# Patient Record
Sex: Female | Born: 1980 | Race: White | Hispanic: No | Marital: Married | Smoking: Never smoker
Health system: Southern US, Community
[De-identification: ages and names within clinical notes are randomized; demographics above are authoritative.]

## PROBLEM LIST (undated history)

## (undated) DIAGNOSIS — O169 Unspecified maternal hypertension, unspecified trimester: Secondary | ICD-10-CM

## (undated) DIAGNOSIS — M419 Scoliosis, unspecified: Secondary | ICD-10-CM

## (undated) DIAGNOSIS — G43909 Migraine, unspecified, not intractable, without status migrainosus: Secondary | ICD-10-CM

## (undated) HISTORY — PX: CHOLECYSTECTOMY: SHX55

## (undated) HISTORY — PX: OTHER SURGICAL HISTORY: SHX169

## (undated) HISTORY — PX: TONSILLECTOMY: SUR1361

## (undated) HISTORY — DX: Unspecified maternal hypertension, unspecified trimester: O16.9

## (undated) HISTORY — DX: Scoliosis, unspecified: M41.9

## (undated) HISTORY — DX: Migraine, unspecified, not intractable, without status migrainosus: G43.909

---

## 2007-09-07 ENCOUNTER — Encounter: Payer: Self-pay | Admitting: Family Medicine

## 2007-09-10 ENCOUNTER — Encounter: Payer: Self-pay | Admitting: Family Medicine

## 2007-09-15 ENCOUNTER — Emergency Department: Payer: Self-pay | Admitting: Emergency Medicine

## 2007-09-15 ENCOUNTER — Other Ambulatory Visit: Payer: Self-pay

## 2007-10-31 ENCOUNTER — Observation Stay: Payer: Self-pay | Admitting: Certified Nurse Midwife

## 2007-11-05 ENCOUNTER — Observation Stay: Payer: Self-pay | Admitting: Obstetrics and Gynecology

## 2007-11-11 ENCOUNTER — Ambulatory Visit: Payer: Self-pay | Admitting: Obstetrics and Gynecology

## 2007-11-14 ENCOUNTER — Inpatient Hospital Stay: Payer: Self-pay | Admitting: Obstetrics and Gynecology

## 2008-09-25 ENCOUNTER — Emergency Department: Payer: Self-pay | Admitting: Emergency Medicine

## 2008-12-27 ENCOUNTER — Emergency Department: Payer: Self-pay | Admitting: Emergency Medicine

## 2009-06-14 IMAGING — CR DG CHEST 2V
1 series · 2 of 2 positions shown · non-contrast
Comparison: none

REASON FOR EXAM: cp
COMMENTS:

[Series 1: view not recorded · 0.17mm/px · 2 of 2 slices shown]
[im 1/2]
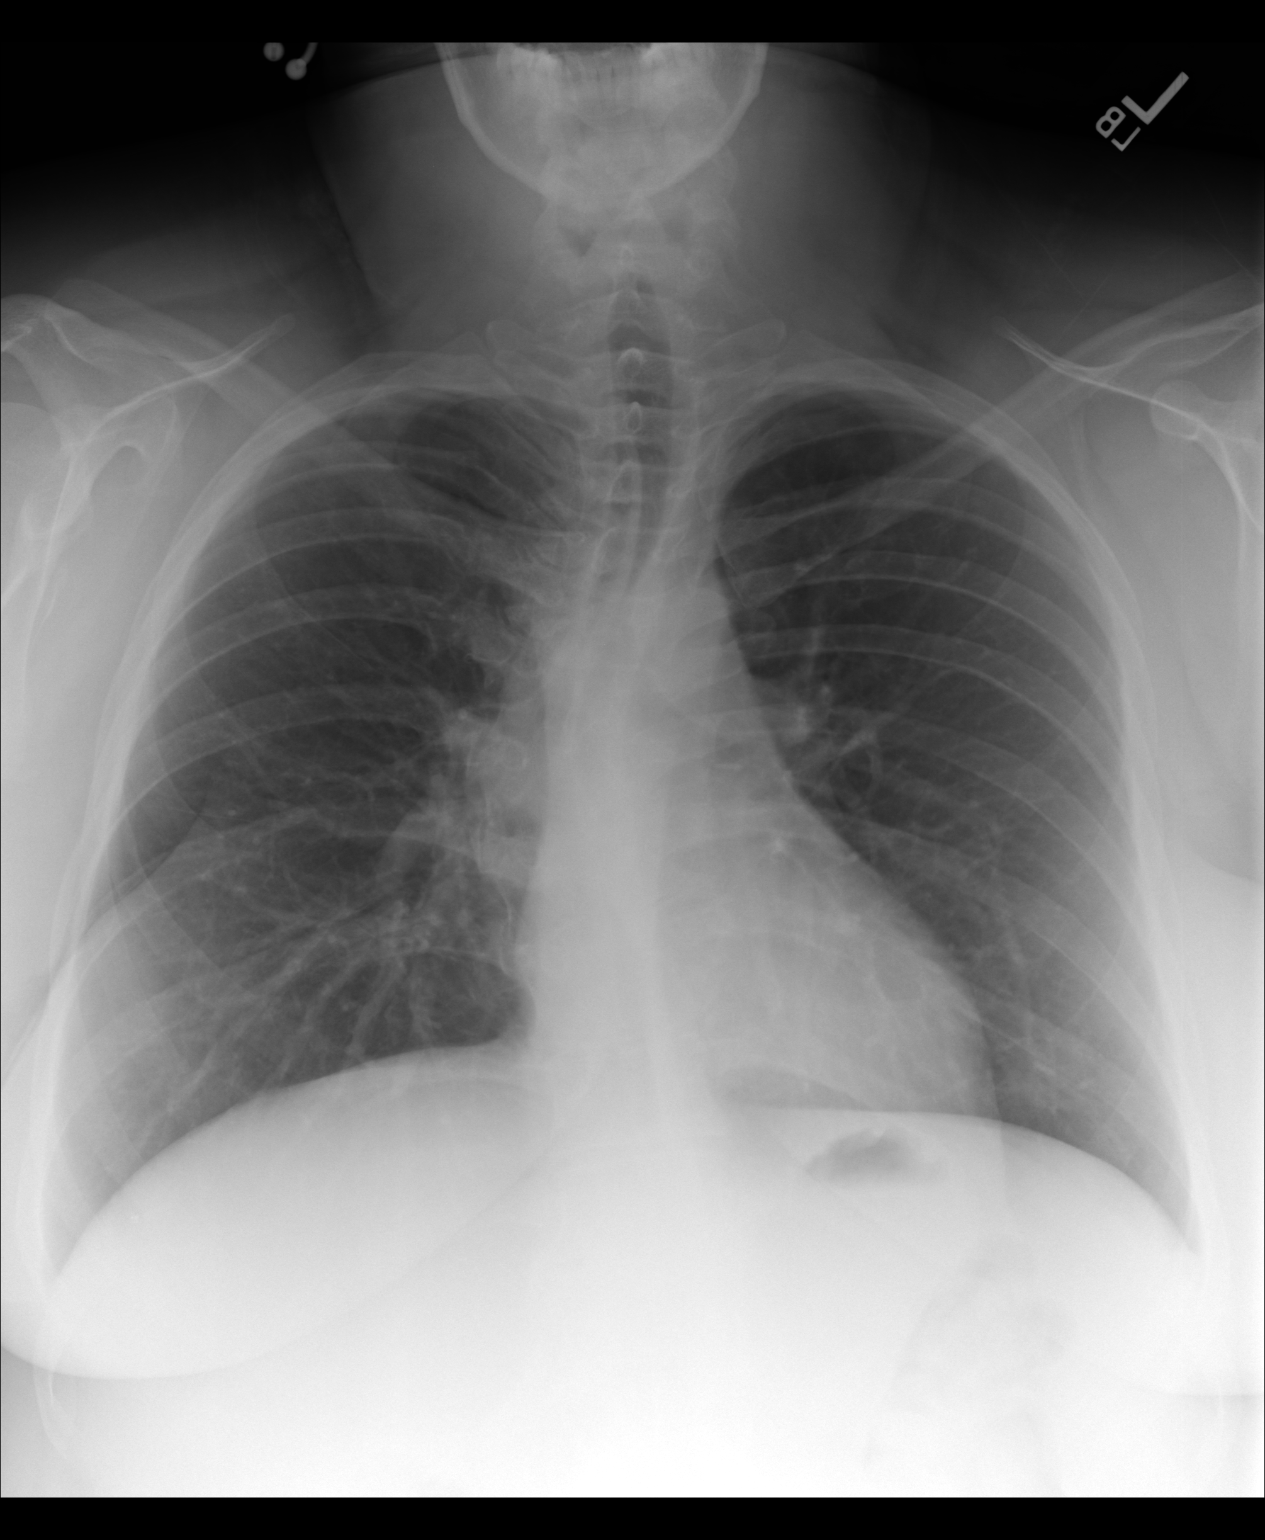
[im 2/2]
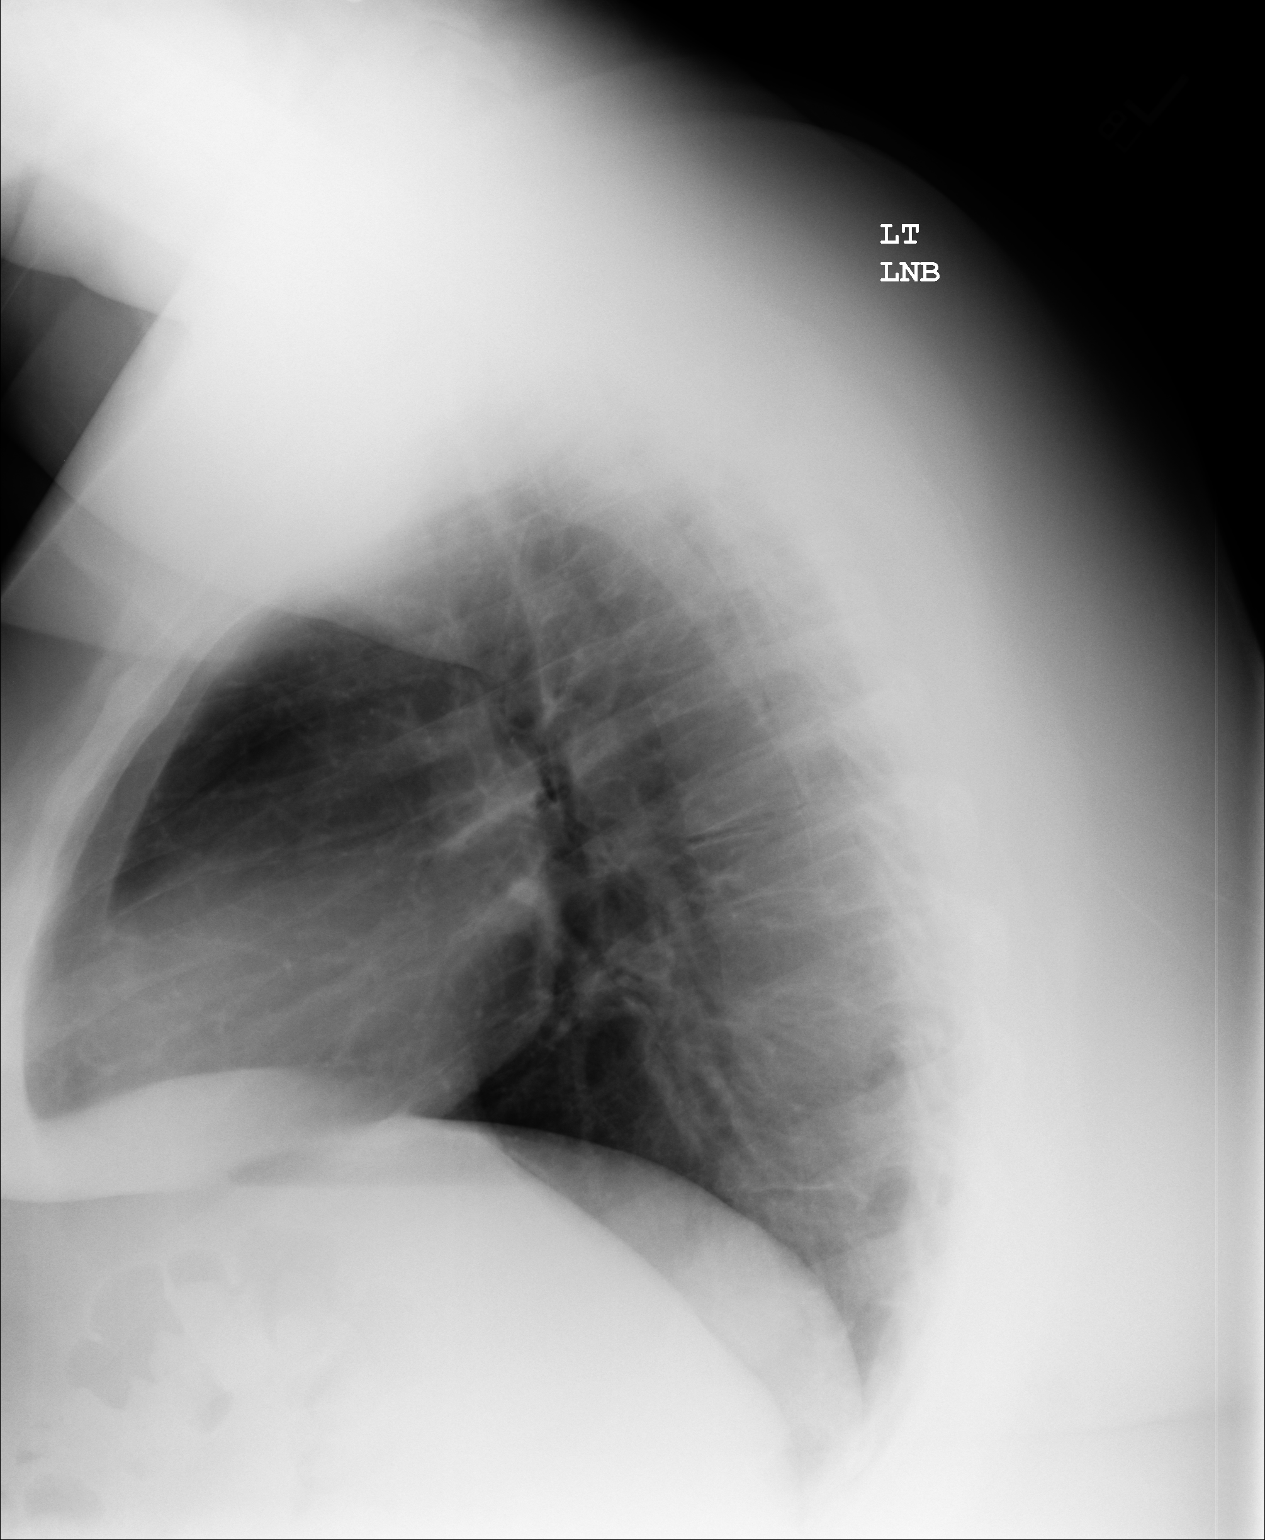

[2 of 2 positions shown; findings below may reference images not displayed]

PROCEDURE:     DXR - DXR CHEST PA (OR AP) AND LATERAL  - September 25, 2008  [DATE]

RESULT:     The lung fields are clear. No pneumonia, pneumothorax or pleural
effusion is seen.  There is increased density about the RIGHT hilar region
secondary to overlying thoracic spine.  There is a mild thoracic scoliosis
with the convexity to the RIGHT and which measures approximately 16 degrees.
No acute bony abnormalities are seen. Heart size is normal.
IMPRESSION: 1.     No acute changes are identified.

## 2018-03-07 ENCOUNTER — Ambulatory Visit (INDEPENDENT_AMBULATORY_CARE_PROVIDER_SITE_OTHER): Payer: Self-pay | Admitting: Podiatry

## 2018-03-07 DIAGNOSIS — M79676 Pain in unspecified toe(s): Secondary | ICD-10-CM

## 2018-03-07 DIAGNOSIS — M722 Plantar fascial fibromatosis: Secondary | ICD-10-CM

## 2018-03-07 DIAGNOSIS — M779 Enthesopathy, unspecified: Secondary | ICD-10-CM

## 2018-03-07 MED ORDER — METHYLPREDNISOLONE 4 MG PO TBPK: ORAL_TABLET | ORAL | 0 refills | Status: DC

## 2018-03-07 NOTE — Progress Notes (Signed)
  Subjective:  Patient ID: Angela Martin, female    DOB: Jan 27, 1981,  MRN: [id number]  Chief Complaint  Patient presents with  . Foot Pain    Pain-numbness: L 4th and 5th toes x 1 mo; 8/10 sharp pain -no injury Pt. stated," I went to [facility] last night for that pain and they took x-rays and said my bones are crooking." Tx: none  . Plantar Warts    LEsion: L 2nd sub met x 2 wks; very tender Tx: OTC wart removal    37 y.o. female presents with the above complaint.  Reports pain and numbness in the left fourth and fifth toes for the past month.  Reports severe 10 sharp pain without injury.  States that she went to [place] for the pain last night and took x-rays and states that her bones were correct. Complains of callused lesion to the L forefoot.  Has tried over-the-counter wart removal.  States that is been present for about 2 weeks and very tender.  Also reports right heel pain present for several weeks.  No past medical history on file.   Current Outpatient Medications:  .  methylPREDNISolone (MEDROL DOSEPAK) 4 MG TBPK tablet, 6 Day Taper Pack. Take as Directed., Disp: 21 tablet, Rfl: 0  Not on File Review of Systems: Negative except as noted in the HPI. Denies N/V/F/Ch. Objective:  There were no vitals filed for this visit. General AA&O x3. Normal mood and affect.  Vascular Dorsalis pedis and posterior tibial pulses  present 2+ bilaterally  Capillary refill normal to all digits. Pedal hair growth normal.  Neurologic Epicritic sensation grossly present.  Dermatologic No open lesions. Interspaces clear of maceration. Nails well groomed and normal in appearance. H PK sub-met 2 left  Orthopedic: MMT 5/5 in dorsiflexion, plantarflexion, inversion, and eversion. Normal joint ROM without pain or crepitus. Metatarsus adductus bilateral.  Genu valgum bilateral. Slight forefoot varus   Assessment & Plan:  Patient was evaluated and treated and all questions answered.  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  No periosteal reaction about the lateral aspect of the left foot.  There does appear to be possible stress line at the fifth met diaphyseal junction age-indeterminate. -Discussed the benefit of orthotics to realign the foot to prevent lateral pressure.  Patient will purchase over-the-counter power step inserts  L 2nd MPJ Lesion -Due to underlying long second metatarsal.  No verrucous appearance  Plantar Fasciitis, right - XR reviewed as above.  - Educated on icing and stretching. - Injection delivered to the plantar fascia as below.  Procedure: Injection Tendon/Ligament Location: Right plantar fascia at the glabrous junction; medial approach. Skin Prep: Alcohol. Injectate: 1 cc 0.5% marcaine plain, 1 cc dexamethasone phosphate, 0.5 cc kenalog 10. Disposition: Patient tolerated procedure well. Injection site dressed with a band-aid.  Return in about 3 weeks (around 03/28/2018) for Plantar Fasciitis R.

## 2018-03-14 ENCOUNTER — Ambulatory Visit (INDEPENDENT_AMBULATORY_CARE_PROVIDER_SITE_OTHER): Payer: Self-pay | Admitting: Podiatry

## 2018-03-14 DIAGNOSIS — S92355A Nondisplaced fracture of fifth metatarsal bone, left foot, initial encounter for closed fracture: Secondary | ICD-10-CM

## 2018-03-14 MED ORDER — OXYCODONE-ACETAMINOPHEN 5-325 MG PO TABS: 1.0000 | ORAL_TABLET | ORAL | 0 refills | Status: DC | PRN

## 2018-03-14 NOTE — Patient Instructions (Signed)
Metatarsal Fracture A metatarsal fracture is a broken bone in one of the five bones that connect your toes to the rest of your foot (forefoot fracture). Metatarsals are long bones that can be stressed or cracked easily. A metatarsal fracture can be:  A stress fracture. Stress fractures are cracks in the surface of the metatarsal bone. Athletes often get stress fractures.  A complete fracture. A complete fracture goes all the way through the bone.  The bone that connects to the pinky toe (fifth metatarsal) is the most commonly fractured metatarsal. Ballet dancers often fracture this bone. What are the causes? This type of fracture may be caused by:  A sudden twisting of your foot.  A fall onto your foot.  Overuse or repetitive exercise.  What increases the risk? This condition is more likely to develop in people who:  Play contact sports.  Have a bone disease.  Have a low calcium level.  What are the signs or symptoms? Symptoms of this condition include:  Pain that is worse when walking or standing.  Pain when pressing on the foot or moving the toes.  Swelling.  Bruising on the top or bottom of the foot.  A foot that appears shorter than the other one.  How is this diagnosed? This condition is diagnosed with a physical exam. You may also have imaging tests, such as:  X-rays.  A CT scan.  MRI.  How is this treated? Treatment for this condition depends on its severity and whether a bone has moved out of place. Treatment may involve:  Rest.  Wearing foot support such as a cast, splint, or boot for several weeks.  Using crutches.  Surgery to move bones back into the right position. Surgery is usually needed if there are many pieces of broken bone or bones that are very out of place (displaced fracture).  Physical therapy. This may be needed to help you regain full movement and strength in your foot.  You will need to return to your health care provider to have  X-rays taken until your bones heal. Your health care provider will look at the X-rays to make sure that your foot is healing well. Follow these instructions at home: If you have a cast:  Do not stick anything inside the cast to scratch your skin. Doing that increases your risk of infection.  Check the skin around the cast every day. Report any concerns to your health care provider. You may put lotion on dry skin around the edges of the cast. Do not apply lotion to the skin underneath the cast.  Keep the cast clean and dry. If you have a splint or a supportive boot:  Wear it as directed by your health care provider. Remove it only as directed by your health care provider.  Loosen it if your toes become numb and tingle, or if they turn cold and blue.  Keep it clean and dry. Bathing  Do not take baths, swim, or use a hot tub until your health care provider approves. Ask your health care provider if you can take showers. You may only be allowed to take sponge baths for bathing.  If your health care provider approves bathing and showering, cover the cast or splint with a watertight plastic bag to protect it from water. Do not let the cast or splint get wet. Managing pain, stiffness, and swelling  If directed, apply ice to the injured area (if you have a splint, not a cast). ? Put   ice in a plastic bag. ? Place a towel between your skin and the bag. ? Leave the ice on for 20 minutes, 2-3 times per day.  Move your toes often to avoid stiffness and to lessen swelling.  Raise (elevate) the injured area above the level of your heart while you are sitting or lying down. Driving  Do not drive or operate heavy machinery while taking pain medicine.  Do not drive while wearing foot support on a foot that you use for driving. Activity  Return to your normal activities as directed by your health care provider. Ask your health care provider what activities are safe for you.  Perform exercises as  directed by your health care provider or physical therapist. Safety  Do not use the injured foot to support your body weight until your health care provider says that you can. Use crutches as directed by your health care provider. General instructions  Do not put pressure on any part of the cast or splint until it is fully hardened. This may take several hours.  Do not use any tobacco products, including cigarettes, chewing tobacco, or e-cigarettes. Tobacco can delay bone healing. If you need help quitting, ask your health care provider.  Take medicines only as directed by your health care provider.  Keep all follow-up visits as directed by your health care provider. This is important. Contact a health care provider if:  You have a fever.  Your cast, splint, or boot is too loose or too tight.  Your cast, splint, or boot is damaged.  Your pain medicine is not helping.  You have pain, tingling, or numbness in your foot that is not going away. Get help right away if:  You have severe pain.  You have tingling or numbness in your foot that is getting worse.  Your foot feels cold or becomes numb.  Your foot changes color. This information is not intended to replace advice given to you by your health care provider. Make sure you discuss any questions you have with your health care provider. Document Released: 07/18/2002 Document Revised: 06/30/2016 Document Reviewed: 08/22/2014 Elsevier Interactive Patient Education  2018 [employer]  

## 2018-03-14 NOTE — Progress Notes (Signed)
  Subjective:  Patient ID: Angela Martin, female    DOB: 18-Sep-1981,  MRN: [id number]  Chief Complaint  Patient presents with  . Foot Injury    L foot ( tripped over dog and heard it cracked) x Wed; 5/10 sharp pain Tx: oxycodone   37 y.o. female returns for the above complaint.  Reports pain to the left foot.  States she stepped over her dog on Wednesday felt a crack.  Went to the right of ED where she was told she had a fracture in her foot and given a posterior splint.  Has been nonweightbearing with the assistance of a new screw the treatment.  Objective:  There were no vitals filed for this visit. General AA&O x3. Normal mood and affect.  Vascular Pedal pulses palpable.  Edema left foot  Neurologic Epicritic sensation grossly intact.  Dermatologic No open lesions. Skin normal texture and turgor.  Orthopedic:  Pain location about the fifth metatarsal proximal shaft   Assessment & Plan:  Patient was evaluated and treated and all questions answered.  Jones fracture left foot -X-rays reviewed from [place].  Jones fracture left fifth metatarsal without displacement -Posterior splint reapplied -Educated on nonweightbearing for his healing fracture. -We will follow-up in 2 weeks for repeat x-rays.  No surgical intervention at this time  Return in about 2 weeks (around 03/28/2018) for Jones Fracture F/u.

## 2018-03-28 ENCOUNTER — Ambulatory Visit (INDEPENDENT_AMBULATORY_CARE_PROVIDER_SITE_OTHER): Payer: Self-pay

## 2018-03-28 ENCOUNTER — Encounter: Payer: Self-pay | Admitting: Podiatry

## 2018-03-28 ENCOUNTER — Ambulatory Visit (INDEPENDENT_AMBULATORY_CARE_PROVIDER_SITE_OTHER): Payer: Self-pay | Admitting: Podiatry

## 2018-03-28 DIAGNOSIS — S92355A Nondisplaced fracture of fifth metatarsal bone, left foot, initial encounter for closed fracture: Secondary | ICD-10-CM

## 2018-03-28 NOTE — Progress Notes (Signed)
  Subjective:  Patient ID: Shantale Holtmeyer, female    DOB: [birth year],  MRN: [id number]  Chief Complaint  Patient presents with  . Foot Injury    F/U L foot fx Pt. stated," foot is still painful at times and it swells ups."    37 y.o. female returns for the above complaint. Still having 6/10 pain. Reports swelling. States she has not walked on it.  Objective:  There were no vitals filed for this visit. General AA&O x3. Normal mood and affect.  Vascular Pedal pulses palpable.  Edema left foot  Neurologic Epicritic sensation grossly intact.  Dermatologic No open lesions. Skin normal texture and turgor.  Orthopedic:  Pain location about the fifth metatarsal proximal shaft   Assessment & Plan:  Patient was evaluated and treated and all questions answered.  Jones fracture left foot -X-rays taken and reviewed. No bridging callus. No displacement. -Too swollen for cast application. Multilayer compression wrap applied today consisting of Unna, Cast padding, Coban -Patient to reapply splint at home. -Continue NWB with knee scooter.   No follow-ups on file.

## 2018-04-25 ENCOUNTER — Ambulatory Visit (INDEPENDENT_AMBULATORY_CARE_PROVIDER_SITE_OTHER): Payer: Self-pay | Admitting: Podiatry

## 2018-04-25 DIAGNOSIS — S92355A Nondisplaced fracture of fifth metatarsal bone, left foot, initial encounter for closed fracture: Secondary | ICD-10-CM

## 2018-05-10 ENCOUNTER — Ambulatory Visit (INDEPENDENT_AMBULATORY_CARE_PROVIDER_SITE_OTHER): Payer: Self-pay | Admitting: Podiatry

## 2018-05-10 ENCOUNTER — Ambulatory Visit (INDEPENDENT_AMBULATORY_CARE_PROVIDER_SITE_OTHER): Payer: Self-pay

## 2018-05-10 DIAGNOSIS — S92355A Nondisplaced fracture of fifth metatarsal bone, left foot, initial encounter for closed fracture: Secondary | ICD-10-CM

## 2018-05-29 NOTE — Progress Notes (Signed)
  Subjective:  Patient ID: Angela RileyStephanie Martin, female    DOB: 04/14/1981,  MRN: [id number]  Chief Complaint  Patient presents with  . Injury    F/U L jones fx Pt. stated," it doesn't swell up as often and no pain at all, so I think it's improving. Also, I'm concern that I can't move my L 4th and 5th toes." Tx: knee scooter, ace wrapping, and unna boot (helped some)   37 y.o. female returns for the above complaint.  States it does not swell as often no pain.  Thinks is improving concerned she cannot move her fourth and fifth toes.  Objective:  There were no vitals filed for this visit. General AA&O x3. Normal mood and affect.  Vascular Pedal pulses palpable.  Edema left foot  Neurologic Epicritic sensation grossly intact.  Dermatologic No open lesions. Skin normal texture and turgor.  Orthopedic:  Pain location about the fifth metatarsal proximal shaft   Assessment & Plan:  Patient was evaluated and treated and all questions answered.  Jones fracture left foot -X-rays taken and reviewed. No bridging callus. No displacement. -Continue NWB with knee scooter.   Return in about 2 weeks (around 05/09/2018) for fractur f/u , Left.

## 2018-05-29 NOTE — Progress Notes (Signed)
  Subjective:  Patient ID: Angela Martin, female    DOB: 12/24/1980,  MRN: [id number]  Chief Complaint  Patient presents with  . Foot Injury    F/U L foot fx Pt. stated," Better w/ the pain, but it's still swollen. 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 female returns for the above complaint.  States that the pain is better but still swollen.  States that she went back to work  Objective:  There were no vitals filed for this visit. General AA&O x3. Normal mood and affect.  Vascular Pedal pulses palpable.  Edema left foot  Neurologic Epicritic sensation grossly intact.  Dermatologic No open lesions. Skin normal texture and turgor.  Orthopedic:  Pain location about the fifth metatarsal proximal shaft   Assessment & Plan:  Patient was evaluated and treated and all questions answered.  Jones fracture left foot -Weight-bear as tolerated normal shoes.  Follow-up for further complaints.  Return if symptoms worsen or fail to improve.

## 2020-01-02 ENCOUNTER — Encounter: Payer: Self-pay | Admitting: *Deleted

## 2020-01-02 ENCOUNTER — Telehealth: Payer: Self-pay | Admitting: *Deleted

## 2020-01-02 ENCOUNTER — Ambulatory Visit: Payer: Self-pay | Admitting: Neurology

## 2020-01-02 NOTE — Telephone Encounter (Signed)
Pt no showed new pt appt today. 

## 2020-01-08 ENCOUNTER — Encounter: Payer: Self-pay | Admitting: Neurology

## 2020-04-17 ENCOUNTER — Ambulatory Visit (INDEPENDENT_AMBULATORY_CARE_PROVIDER_SITE_OTHER): Payer: Self-pay

## 2020-04-17 ENCOUNTER — Encounter: Payer: Self-pay | Admitting: Sports Medicine

## 2020-04-17 ENCOUNTER — Ambulatory Visit (INDEPENDENT_AMBULATORY_CARE_PROVIDER_SITE_OTHER): Payer: Self-pay | Admitting: Sports Medicine

## 2020-04-17 ENCOUNTER — Other Ambulatory Visit: Payer: Self-pay

## 2020-04-17 ENCOUNTER — Other Ambulatory Visit: Payer: Self-pay | Admitting: Sports Medicine

## 2020-04-17 DIAGNOSIS — S93401A Sprain of unspecified ligament of right ankle, initial encounter: Secondary | ICD-10-CM

## 2020-04-17 DIAGNOSIS — IMO0001 Reserved for inherently not codable concepts without codable children: Secondary | ICD-10-CM

## 2020-04-17 DIAGNOSIS — R609 Edema, unspecified: Secondary | ICD-10-CM

## 2020-04-17 DIAGNOSIS — M79671 Pain in right foot: Secondary | ICD-10-CM

## 2020-04-17 DIAGNOSIS — S96911A Strain of unspecified muscle and tendon at ankle and foot level, right foot, initial encounter: Secondary | ICD-10-CM

## 2020-04-17 DIAGNOSIS — M25571 Pain in right ankle and joints of right foot: Secondary | ICD-10-CM

## 2020-04-17 DIAGNOSIS — M792 Neuralgia and neuritis, unspecified: Secondary | ICD-10-CM

## 2020-04-17 MED ORDER — PREDNISONE 10 MG (21) PO TBPK
ORAL_TABLET | ORAL | 0 refills | Status: DC
Start: 2020-04-17 — End: 2020-10-09

## 2020-04-17 NOTE — Progress Notes (Signed)
Subjective:  Angela Martin is a 39 y.o. female patient who presents to office for evaluation of right foot and ankle pain. Patient complains of continued pain in the foot and ankle that started about a month ago with pain along the lateral side of the foot but then on yesterday she got her foot caught on the escalator and heard a pop and had immediate swelling and numbness to the right fourth and fifth toes went to urgent care and was diagnosed with tiny bone spurs but reports that it hurts and she cannot put any weight or pressure on the forefoot because when her foot touches the ground it hurts underneath the fourth and fifth metatarsophalangeal joints reports that the tingling is also intensified at the top of her foot that is worse with bending her foot or the more she stays on her foot. Patient has tried compression with no relief in symptoms. Patient denies any other pedal complaints.   No other issues noted.  There are no problems to display for this patient.   Current Outpatient Medications on File Prior to Visit  Medication Sig Dispense Refill   SUMAtriptan (IMITREX) 100 MG tablet Take 100 mg by mouth once as needed for migraine. May repeat in 2 hours if headache persists or recurs. Max daily 200 mg.     No current facility-administered medications on file prior to visit.    Allergies  Allergen Reactions   Morphine And Related    Other     SULFA DRUGS   Oxycodone     Objective:  General: Alert and oriented x3 in no acute distress  Dermatology: No open lesions bilateral lower extremities, no webspace macerations, no ecchymosis bilateral, all nails x 10 are well manicured.  Vascular: Dorsalis Pedis and Posterior Tibial pedal pulses palpable, Capillary Fill Time 3 seconds,(+) pedal hair growth bilateral,Temperature gradient within normal limits.  Focal edema right greater than left lower extremity.  Neurology: Gross sensation intact via light touch bilateral, subjective  numbness and burning over the superficial peroneal nerve course at the dorsal lateral foot and ankle on the right with pain that radiates to the fourth and fifth toes.  Musculoskeletal: Mild tenderness with palpation at lateral ankle and sinus tarsi on the right.  Stress testing unable to be performed due to pain range of motion is guarded due to pain on the right.  Gait: Antalgic gait  Xrays  Right foot/ankle   Impression: No acute osseous findings there is some calcification has increased on the base of the third metatarsal without fracture likely old in nature, there is very minimal calcaneal heel spurs, mild soft tissue swelling but otherwise no other acute findings noted.  Assessment and Plan: Problem List Items Addressed This Visit    None    Visit Diagnoses    Grade 2 ankle sprain, right, initial encounter    -  Primary   Neuritis       Tear of tendon of right ankle, initial encounter       Swelling       Pain in right ankle and joints of right foot           -Complete examination performed -Xrays reviewed -Discussed treatment options likely ankle sprain versus tendon tear on the right -Applied soft cast/Unna boot to the right lower extremity advised patient to keep clean dry and intact for 5 days.  After 5 days patient to use compression sleeve to assist with keeping swelling down on the right foot and  ankle -Advised rest ice elevation and protection daily until symptoms improve -Encourage patient to use her crutches or to get a knee scooter to remain nonweightbearing on the right -Advised patient for pain relief to alternate between Tylenol and ibuprofen -Scribed prednisone Dosepak for patient to take for additional pain and inflammation relief -Patient to return to office in 2 weeks or sooner if condition worsens.  Work note provided to accommodate with light duty if light duty is not available patient will have to refrain out of work until next office visit.  Angela Martin, DPM

## 2020-04-18 ENCOUNTER — Other Ambulatory Visit: Payer: Self-pay | Admitting: Sports Medicine

## 2020-04-18 DIAGNOSIS — M792 Neuralgia and neuritis, unspecified: Secondary | ICD-10-CM

## 2020-04-18 DIAGNOSIS — IMO0001 Reserved for inherently not codable concepts without codable children: Secondary | ICD-10-CM

## 2020-05-03 ENCOUNTER — Other Ambulatory Visit: Payer: Self-pay

## 2020-05-03 ENCOUNTER — Ambulatory Visit (INDEPENDENT_AMBULATORY_CARE_PROVIDER_SITE_OTHER): Payer: Self-pay | Admitting: Sports Medicine

## 2020-05-03 ENCOUNTER — Encounter: Payer: Self-pay | Admitting: Sports Medicine

## 2020-05-03 DIAGNOSIS — M792 Neuralgia and neuritis, unspecified: Secondary | ICD-10-CM

## 2020-05-03 DIAGNOSIS — S93401D Sprain of unspecified ligament of right ankle, subsequent encounter: Secondary | ICD-10-CM

## 2020-05-03 DIAGNOSIS — R609 Edema, unspecified: Secondary | ICD-10-CM

## 2020-05-03 DIAGNOSIS — S96911D Strain of unspecified muscle and tendon at ankle and foot level, right foot, subsequent encounter: Secondary | ICD-10-CM

## 2020-05-03 DIAGNOSIS — M25571 Pain in right ankle and joints of right foot: Secondary | ICD-10-CM

## 2020-05-03 MED ORDER — GABAPENTIN 300 MG PO CAPS
300.0000 mg | ORAL_CAPSULE | Freq: Every day | ORAL | 1 refills | Status: DC
Start: 2020-05-03 — End: 2020-07-16

## 2020-05-03 NOTE — Progress Notes (Signed)
Subjective:  Angela Martin is a 39 y.o. female patient who returns office for follow-up evaluation of right foot and ankle pain reports that if she gets up to walk she starts to get the pain numbness and tingling that goes from 3 out of 10 to 8 out of 10 worse with prolonged standing or walking reports that she initially kept the soft cast intact for about 5-1/2 days and stayed off her foot using her knee scooter and crutches however after she removed the soft cast attempt to walk and still has pain pain is also worsened with her cam boot of which she was in previously when she injured her foot on the back of an escalator.  Patient reports that she has been icing elevating wearing her compression sleeve to tolerance taking Tylenol and ibuprofen.  Patient denies any other pedal complaints at this time.  Patient is assisted by daughter and husband at this visit.  There are no problems to display for this patient.   Current Outpatient Medications on File Prior to Visit  Medication Sig Dispense Refill  . predniSONE (STERAPRED UNI-PAK 21 TAB) 10 MG (21) TBPK tablet Take as directed 21 tablet 0  . SUMAtriptan (IMITREX) 100 MG tablet Take 100 mg by mouth once as needed for migraine. May repeat in 2 hours if headache persists or recurs. Max daily 200 mg.     No current facility-administered medications on file prior to visit.    Allergies  Allergen Reactions  . Morphine And Related   . Other     SULFA DRUGS  . Oxycodone     Objective:  General: Alert and oriented x3 in no acute distress  Dermatology: No open lesions bilateral lower extremities, no webspace macerations, no ecchymosis bilateral, all nails x 10 are well manicured.  Vascular: Dorsalis Pedis and Posterior Tibial pedal pulses palpable, Capillary Fill Time 3 seconds,(+) pedal hair growth bilateral,Temperature gradient within normal limits.  Trace edema to right foot and ankle.  Neurology: Gross sensation intact via light touch  bilateral, subjective numbness and burning over the superficial peroneal nerve course at the dorsal lateral foot and ankle on the right with pain that radiates to the fourth and fifth toes unchanged from prior.  Musculoskeletal: Mild tenderness with palpation at lateral ankle and sinus tarsi on the right.  Stress testing unable to be performed due to pain range of motion is guarded due to pain on the right like before pain intensifies with touch and manipulation.  Assessment and Plan: Problem List Items Addressed This Visit    None    Visit Diagnoses    Sprain of ligament of right ankle, subsequent encounter    -  Primary   Neuritis       Tear of tendon of right ankle, subsequent encounter       Swelling       Pain in right ankle and joints of right foot           -Complete examination performed -Discussed again with patient nature of injury will likely sprain with neuritis -Prescribed gabapentin for nerve pain -Advised rest ice elevation and protection daily until symptoms improve -Encourage patient to continue with use of crutches or knee scooter to remain nonweightbearing for painful to walk or to return to using her cam boot to protect her foot since we are unsure if there is a tear -Advised patient for pain relief to alternate between Tylenol and ibuprofen like previous -Recommend ultrasound or MRI office will assist  patient with checking on the cash price for the studies that she continues to have worsening pain -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

## 2020-05-04 ENCOUNTER — Encounter: Payer: Self-pay | Admitting: Sports Medicine

## 2020-05-06 ENCOUNTER — Telehealth: Payer: Self-pay | Admitting: *Deleted

## 2020-05-06 NOTE — Telephone Encounter (Signed)
-----   Message from Asencion Islam, [place] sent at 05/04/2020 12:00 AM EDT ----- Regarding: Self pay price for MRI or Ultrasound Recommended to patient that she will need a MRI or ultrasound to rule out tear of tendon of the ankle on the right foot can you please check to see if we can figure out what the cash price will be for this patient or give her the contact information to contact the appropriate imaging centers since she does not have any insurance and is considered self-pay Thanks Dr. Marylene Land

## 2020-05-07 NOTE — Telephone Encounter (Signed)
I was unable leave message to inform pt of MRI [id number] for ankle and [place] [phone] to discuss cost with [facility] [phone] or [facility] [phone], due to the voicemail was full.

## 2020-05-07 NOTE — Telephone Encounter (Signed)
Tanna Furry - FMLA and Disability coordinator informed pt of my 05/06/2020 and 05/07/2020 messages.

## 2020-05-08 ENCOUNTER — Telehealth: Payer: Self-pay

## 2020-05-08 NOTE — Telephone Encounter (Signed)
Pt called and LVM stating she needs to know what kind of MRI is Dr. Marylene Land requesting so she can call GI for the cost of it. I returned pt call to let her know the CPT codes but the mail box is full and was unable to leave a message

## 2020-05-21 ENCOUNTER — Telehealth: Payer: Self-pay

## 2020-05-21 NOTE — Telephone Encounter (Signed)
I returned pt's phone call to let her know the CPT codes and what type of MRI Dr. Marylene Land is suggesting for her, but the mail box is full and can not leave any new messages.

## 2020-06-03 ENCOUNTER — Telehealth: Payer: Self-pay

## 2020-06-03 DIAGNOSIS — S93401D Sprain of unspecified ligament of right ankle, subsequent encounter: Secondary | ICD-10-CM

## 2020-06-03 DIAGNOSIS — S96911D Strain of unspecified muscle and tendon at ankle and foot level, right foot, subsequent encounter: Secondary | ICD-10-CM

## 2020-06-03 DIAGNOSIS — R609 Edema, unspecified: Secondary | ICD-10-CM

## 2020-06-03 NOTE — Telephone Encounter (Signed)
Pt had LVM stating she would like to get her MRI done at [facility].  Pt also stated they don't do [place], that is why she choose to get the MRI

## 2020-06-03 NOTE — Telephone Encounter (Signed)
MRI R ankle r/o tear history of sprain Patient wants to get it done at [facility] imaging Thanks Dr Marylene Land

## 2020-06-03 NOTE — Telephone Encounter (Signed)
Yes patient can go to [facility] imaging if she can afford a MRI then she can get that, if not then get an Ultrasound Thanks Dr. Kathie Rhodes

## 2020-06-03 NOTE — Telephone Encounter (Signed)
Unable to leave a message informing pt that [facility] could perform the [place].

## 2020-06-04 NOTE — Telephone Encounter (Signed)
Unable to leave a message voicemail box was full.

## 2020-06-06 NOTE — Addendum Note (Signed)
Addended by: Alphia Kava D on: 06/06/2020 12:54 PM   Modules accepted: Orders

## 2020-06-06 NOTE — Telephone Encounter (Signed)
I informed pt the [place] would be less expensive and if inconclusive then would order MRI. Pt states understanding. Faxed orders to [facility] for [place] right foot.

## 2020-07-16 ENCOUNTER — Encounter: Payer: Self-pay | Admitting: Physician Assistant

## 2020-07-16 ENCOUNTER — Ambulatory Visit (INDEPENDENT_AMBULATORY_CARE_PROVIDER_SITE_OTHER): Payer: Self-pay | Admitting: Physician Assistant

## 2020-07-16 ENCOUNTER — Other Ambulatory Visit: Payer: Self-pay

## 2020-07-16 VITALS — BP 120/78 | HR 92 | Temp 97.9°F | Ht 65.0 in | Wt 251.8 lb

## 2020-07-16 DIAGNOSIS — R519 Headache, unspecified: Secondary | ICD-10-CM

## 2020-07-16 DIAGNOSIS — G441 Vascular headache, not elsewhere classified: Secondary | ICD-10-CM

## 2020-07-16 HISTORY — DX: Headache, unspecified: R51.9

## 2020-07-16 LAB — POC COVID19 BINAXNOW: SARS Coronavirus 2 Ag: NEGATIVE

## 2020-07-16 MED ORDER — GABAPENTIN 300 MG PO CAPS: 300.0000 mg | ORAL_CAPSULE | Freq: Every day | ORAL | 0 refills | Status: DC

## 2020-07-16 MED ORDER — UBRELVY 50 MG PO TABS: ORAL_TABLET | ORAL | 0 refills | Status: DC

## 2020-07-16 NOTE — Progress Notes (Signed)
New Patient Office Visit  Subjective:  Patient ID: Angela Martin, female    DOB: [birth year]  Age: 39 y.o. MRN: [id number]  CC:  Chief Complaint  Patient presents with   Headache    HPI Vanity Larsson presents for headaches  Pt states that she had been a patient at [facility] and they shut down - she states she began having headaches last year - states starts at top of her neck and then extends to the top of her head - says she was given neurontin 365m qd which helped her headaches - she was also tried on imitrex but states that she feels that medication is not helping  She states that she smokes marijuana which helps at times -  Patient states that she has had no labwork or no other type of workup for her headaches SMichela Pitchershe ran out of medication a few days ago and she started with a headache yesterday She denies nausea or vomiting - has had some photophobia   Past Medical History:  Diagnosis Date   Hypertension during pregnancy    Migraines    Scoliosis     Past Surgical History:  Procedure Laterality Date   CESAREAN SECTION     CHOLECYSTECTOMY     plantar wart removal  Right    foot   TONSILLECTOMY      Family History  Problem Relation Age of Onset   Heart murmur Mother    Heart disease Mother    Cancer Father    GER disease Father    Colitis Father    Ovarian cancer Sister        at age 37  Other Sister        disc issues   Congestive Heart Failure Maternal Grandmother    Emphysema Paternal Grandmother     Social History   Socioeconomic History   Marital status: Married    Spouse name: Not on file   Number of children: Not on file   Years of education: Not on file   Highest education level: Not on file  Occupational History   Not on file  Tobacco Use   Smoking status: Never Smoker   Smokeless tobacco: Never Used  Substance and Sexual Activity   Alcohol use: Never   Drug use: Yes    Types: Marijuana    Sexual activity: Not on file  Other Topics Concern   Not on file  Social History Narrative   Not on file   Social Determinants of Health   Financial Resource Strain:    Difficulty of Paying Living Expenses: Not on file  Food Insecurity:    Worried About Running Out of Food in the Last Year: Not on file   Ran Out of Food in the Last Year: Not on file  Transportation Needs:    Lack of Transportation (Medical): Not on file   Lack of Transportation (Non-Medical): Not on file  Physical Activity:    Days of Exercise per Week: Not on file   Minutes of Exercise per Session: Not on file  Stress:    Feeling of Stress : Not on file  Social Connections:    Frequency of Communication with Friends and Family: Not on file   Frequency of Social Gatherings with Friends and Family: Not on file   Attends Religious Services: Not on file   Active Member of Clubs or Organizations: Not on file   Attends CArchivistMeetings: Not on file  Marital Status: Not on file  Intimate Partner Violence:    Fear of Current or Ex-Partner: Not on file   Emotionally Abused: Not on file   Physically Abused: Not on file   Sexually Abused: Not on file     Current Outpatient Medications:    gabapentin (NEURONTIN) 300 MG capsule, Take 1 capsule (300 mg total) by mouth at bedtime., Disp: 30 capsule, Rfl: 0   predniSONE (STERAPRED UNI-PAK 21 TAB) 10 MG (21) TBPK tablet, Take as directed, Disp: 21 tablet, Rfl: 0   Ubrogepant (UBRELVY) 50 MG TABS, Take one at onset of headache - may repeat once in 2 hours if headache persistent, Disp: 10 tablet, Rfl: 0   Allergies  Allergen Reactions   Morphine And Related    Other     SULFA DRUGS   Oxycodone     ROS CONSTITUTIONAL: has had some chronic fatigue E/N/T: Negative for ear pain, nasal congestion and sore throat.  CARDIOVASCULAR: Negative for chest pain, dizziness, palpitations and pedal edema.  RESPIRATORY: Negative for recent  cough and dyspnea.  GASTROINTESTINAL: Negative for abdominal pain, acid reflux symptoms, constipation, diarrhea, nausea and vomiting.  NEUROLOGICAL: see HPI PSYCHIATRIC: Negative for sleep disturbance and to question depression screen.  Negative for depression, negative for anhedonia.        Objective:    PHYSICAL EXAM:   VS: BP 120/78    Pulse 92    Temp 97.9 F (36.6 C)    Ht '5\' 5"'  (1.651 m)    Wt 251 lb 12.8 oz (114.2 kg)    SpO2 99%    BMI 41.90 kg/m   GEN: Well nourished, well developed, pt very fidgety in room and tugging at head and hair - out of context with exam HEENT: normal external ears and nose - normal external auditory canals and TMS - hearing grossly normal - -  Oropharynx - normal mucosa, palate, and posterior pharynx Cardiac: RRR; no murmurs, rubs, or gallops,no edema -  Respiratory:  normal respiratory rate and pattern with no distress - normal breath sounds with no rales, rhonchi, wheezes or rubs  Neuro:  Alert and Oriented x 3, Psych: euthymic mood, appropriate affect and demeanor  BP 120/78    Pulse 92    Temp 97.9 F (36.6 C)    Ht '5\' 5"'  (1.651 m)    Wt 251 lb 12.8 oz (114.2 kg)    SpO2 99%    BMI 41.90 kg/m  Wt Readings from Last 3 Encounters:  07/16/20 251 lb 12.8 oz (114.2 kg)     Health Maintenance Due  Topic Date Due   PAP SMEAR-Modifier  Never done    There are no preventive care reminders to display for this patient.  No results found for: TSH No results found for: WBC, HGB, HCT, MCV, PLT No results found for: NA, K, CHLORIDE, CO2, GLUCOSE, BUN, CREATININE, BILITOT, ALKPHOS, AST, ALT, PROT, ALBUMIN, CALCIUM, ANIONGAP, EGFR, GFR No results found for: CHOL No results found for: HDL No results found for: LDLCALC No results found for: TRIG No results found for: CHOLHDL No results found for: HGBA1C    Assessment & Plan:   Problem List Items Addressed This Visit      Other   Cephalalgia - Primary    Rapid COVID negative rx for ubrelvy  (samples given) Refill of gabapentin given      Relevant Medications   Ubrogepant (UBRELVY) 50 MG TABS   gabapentin (NEURONTIN) 300 MG capsule   Other  Relevant Orders   POC COVID-19 BinaxNow   CBC with Differential/Platelet   Comprehensive metabolic panel   TSH   Sedimentation rate      Meds ordered this encounter  Medications   Ubrogepant (UBRELVY) 50 MG TABS    Sig: Take one at onset of headache - may repeat once in 2 hours if headache persistent    Dispense:  10 tablet    Refill:  0    Order Specific Question:   Supervising Provider    Answer:   Shelton Silvas   gabapentin (NEURONTIN) 300 MG capsule    Sig: Take 1 capsule (300 mg total) by mouth at bedtime.    Dispense:  30 capsule    Refill:  0    Order Specific Question:   Supervising Provider    Answer:   Shelton Silvas    Follow-up: Return in about 1 month (around 08/15/2020).    SARA R Twilla Khouri, PA-C

## 2020-07-16 NOTE — Assessment & Plan Note (Signed)
Rapid COVID negative rx for ubrelvy (samples given) Refill of gabapentin given

## 2020-07-17 LAB — CBC WITH DIFFERENTIAL/PLATELET
Basophils Absolute: 0.1 10*3/uL (ref 0.0–0.2)
Basos: 1 %
EOS (ABSOLUTE): 0.4 10*3/uL (ref 0.0–0.4)
Eos: 6 %
Hematocrit: 40.2 % (ref 34.0–46.6)
Hemoglobin: 13 g/dL (ref 11.1–15.9)
Immature Grans (Abs): 0 10*3/uL (ref 0.0–0.1)
Immature Granulocytes: 0 %
Lymphocytes Absolute: 2 10*3/uL (ref 0.7–3.1)
Lymphs: 29 %
MCH: 28.8 pg (ref 26.6–33.0)
MCHC: 32.3 g/dL (ref 31.5–35.7)
MCV: 89 fL (ref 79–97)
Monocytes Absolute: 0.7 10*3/uL (ref 0.1–0.9)
Monocytes: 9 %
Neutrophils Absolute: 3.9 10*3/uL (ref 1.4–7.0)
Neutrophils: 55 %
Platelets: 322 10*3/uL (ref 150–450)
RBC: 4.52 x10E6/uL (ref 3.77–5.28)
RDW: 12.7 % (ref 11.7–15.4)
WBC: 7.1 10*3/uL (ref 3.4–10.8)

## 2020-07-17 LAB — COMPREHENSIVE METABOLIC PANEL
ALT: 14 IU/L (ref 0–32)
AST: 14 IU/L (ref 0–40)
Albumin/Globulin Ratio: 1.4 (ref 1.2–2.2)
Albumin: 4.1 g/dL (ref 3.8–4.8)
Alkaline Phosphatase: 69 IU/L (ref 48–121)
BUN/Creatinine Ratio: 16 (ref 9–23)
BUN: 11 mg/dL (ref 6–20)
Bilirubin Total: 0.4 mg/dL (ref 0.0–1.2)
CO2: 21 mmol/L (ref 20–29)
Calcium: 9 mg/dL (ref 8.7–10.2)
Chloride: 106 mmol/L (ref 96–106)
Creatinine, Ser: 0.69 mg/dL (ref 0.57–1.00)
GFR calc Af Amer: 130 mL/min/{1.73_m2} (ref >59)
GFR calc non Af Amer: 112 mL/min/{1.73_m2} (ref >59)
Globulin, Total: 2.9 g/dL (ref 1.5–4.5)
Glucose: 77 mg/dL (ref 65–99)
Potassium: 4 mmol/L (ref 3.5–5.2)
Sodium: 139 mmol/L (ref 134–144)
Total Protein: 7 g/dL (ref 6.0–8.5)

## 2020-07-17 LAB — SEDIMENTATION RATE: Sed Rate: 3 mm/hr (ref 0–32)

## 2020-07-17 LAB — TSH: TSH: 0.938 u[IU]/mL (ref 0.450–4.500)

## 2020-08-15 ENCOUNTER — Ambulatory Visit: Payer: Self-pay | Admitting: Physician Assistant

## 2020-08-20 ENCOUNTER — Ambulatory Visit (INDEPENDENT_AMBULATORY_CARE_PROVIDER_SITE_OTHER): Payer: Self-pay | Admitting: Legal Medicine

## 2020-08-20 ENCOUNTER — Encounter: Payer: Self-pay | Admitting: Legal Medicine

## 2020-08-20 ENCOUNTER — Other Ambulatory Visit: Payer: Self-pay

## 2020-08-20 VITALS — BP 138/82 | HR 122 | Temp 98.7°F | Ht 65.0 in | Wt 258.2 lb

## 2020-08-20 DIAGNOSIS — F322 Major depressive disorder, single episode, severe without psychotic features: Secondary | ICD-10-CM | POA: Insufficient documentation

## 2020-08-20 DIAGNOSIS — G441 Vascular headache, not elsewhere classified: Secondary | ICD-10-CM

## 2020-08-20 DIAGNOSIS — M41115 Juvenile idiopathic scoliosis, thoracolumbar region: Secondary | ICD-10-CM

## 2020-08-20 HISTORY — DX: Major depressive disorder, single episode, severe without psychotic features: F32.2

## 2020-08-20 MED ORDER — GABAPENTIN 300 MG PO CAPS: 300.0000 mg | ORAL_CAPSULE | Freq: Every day | ORAL | 2 refills | Status: DC

## 2020-08-20 MED ORDER — FLUOXETINE HCL 20 MG PO TABS: 20.0000 mg | ORAL_TABLET | Freq: Every day | ORAL | 3 refills | Status: DC

## 2020-08-20 NOTE — Progress Notes (Signed)
Subjective:  Patient ID: Angela Martin, female    DOB: 1981-02-17  Age: 39 y.o. MRN: [id number]  Chief Complaint  Patient presents with   Migraine    follow up     HPI: long history of scoliosis since youth, surgeons recommended surgery. She did not get.  She has pain and limited motion right shoulder.  Headaches are posterior. And she gets nauseated. She gets numbness in legs.   Patient still has daily headaches without relief from [place].  Her headaches are related to cervical scoliosis  And this needs to be addressed at the problem.  She has severe scoliosis and is getting progressively disabled.  She requires scoliosis evaluation and treatment.   Current Outpatient Medications on File Prior to Visit  Medication Sig Dispense Refill   predniSONE (STERAPRED UNI-PAK 21 TAB) 10 MG (21) TBPK tablet Take as directed (Patient not taking: Reported on 08/20/2020) 21 tablet 0   Ubrogepant (UBRELVY) 50 MG TABS Take one at onset of headache - may repeat once in 2 hours if headache persistent (Patient not taking: Reported on 08/20/2020) 10 tablet 0   No current facility-administered medications on file prior to visit.   Past Medical History:  Diagnosis Date   Hypertension during pregnancy    Migraines    Scoliosis    Past Surgical History:  Procedure Laterality Date   CESAREAN SECTION     CHOLECYSTECTOMY     plantar wart removal  Right    foot   TONSILLECTOMY      Family History  Problem Relation Age of Onset   Heart murmur Mother    Heart disease Mother    Cancer Father    GER disease Father    Colitis Father    Ovarian cancer Sister        at age 89   Other Sister        disc issues   Congestive Heart Failure Maternal Grandmother    Emphysema Paternal Grandmother    Social History   Socioeconomic History   Marital status: Married    Spouse name: Not on file   Number of children: Not on file   Years of education: Not on file   Highest  education level: Not on file  Occupational History   Not on file  Tobacco Use   Smoking status: Never Smoker   Smokeless tobacco: Never Used  Substance and Sexual Activity   Alcohol use: Never   Drug use: Yes    Types: Marijuana   Sexual activity: Yes  Other Topics Concern   Not on file  Social History Narrative   Not on file   Social Determinants of Health   Financial Resource Strain:    Difficulty of Paying Living Expenses: Not on file  Food Insecurity:    Worried About Running Out of Food in the Last Year: Not on file   [employer] of Food in the Last Year: Not on file  Transportation Needs:    Lack of Transportation (Medical): Not on file   Lack of Transportation (Non-Medical): Not on file  Physical Activity:    Days of Exercise per Week: Not on file   Minutes of Exercise per Session: Not on file  Stress:    Feeling of Stress : Not on file  Social Connections:    Frequency of Communication with Friends and Family: Not on file   Frequency of Social Gatherings with Friends and Family: Not on file   Attends Religious Services: Not  on file   Active Member of Clubs or Organizations: Not on file   Attends Club or Organization Meetings: Not on file   Marital Status: Not on file    Review of Systems  Constitutional: Negative.   Eyes: Negative.   Respiratory: Negative for cough and shortness of breath.   Cardiovascular: Negative for chest pain and leg swelling.  Gastrointestinal: Negative.   Genitourinary: Negative.   Musculoskeletal: Positive for back pain.  Neurological: Positive for numbness (hands and feet).  Psychiatric/Behavioral: Negative.      Objective:  BP 138/82    Pulse (!) 122    Temp 98.7 F (37.1 C)    Ht 5\' 5"  (1.651 m)    Wt 258 lb 3.2 oz (117.1 kg)    SpO2 99%    BMI 42.97 kg/m   BP/Weight 08/20/2020 07/16/2020  Systolic BP 138 120  Diastolic BP 82 78  Wt. (Lbs) 258.2 251.8  BMI 42.97 41.9    Physical Exam Vitals reviewed.   Constitutional:      Appearance: Normal appearance.  HENT:     Head: Normocephalic.     Right Ear: Tympanic membrane, ear canal and external ear normal.     Left Ear: Tympanic membrane, ear canal and external ear normal.     Mouth/Throat:     Pharynx: Oropharynx is clear.  Eyes:     Extraocular Movements: Extraocular movements intact.     Conjunctiva/sclera: Conjunctivae normal.     Pupils: Pupils are equal, round, and reactive to light.  Cardiovascular:     Rate and Rhythm: Normal rate and regular rhythm.     Pulses: Normal pulses.     Heart sounds: Normal heart sounds.  Pulmonary:     Effort: Pulmonary effort is normal.     Breath sounds: Normal breath sounds.  Abdominal:     General: Abdomen is flat. Bowel sounds are normal.  Musculoskeletal:       Arms:     Cervical back: Spasms and tenderness present.     Lumbar back: Tenderness present. Positive left straight leg raise test. Negative right straight leg raise test.       Back:     Comments: Severe scoliosis, multiple tender points.  Skin:    General: Skin is warm and dry.     Capillary Refill: Capillary refill takes less than 2 seconds.  Neurological:     General: No focal deficit present.     Mental Status: She is alert and oriented to person, place, and time.     Comments: Bilateral tinnel, numbness right foot    Depression screen [facility] 2/9 08/20/2020 08/20/2020  Decreased Interest 3 0  Down, Depressed, Hopeless 3 0  PHQ - 2 Score 6 0  Altered sleeping 3 -  Tired, decreased energy 3 -  Change in appetite 3 -  Feeling bad or failure about yourself  2 -  Trouble concentrating 3 -  Moving slowly or fidgety/restless 2 -  Suicidal thoughts 1 -  PHQ-9 Score 23 -  Difficult doing work/chores Extremely dIfficult -       Lab Results  Component Value Date   WBC 7.1 07/16/2020   HGB 13.0 07/16/2020   HCT 40.2 07/16/2020   PLT 322 07/16/2020   GLUCOSE 77 07/16/2020   ALT 14 07/16/2020   AST 14 07/16/2020    NA 139 07/16/2020   K 4.0 07/16/2020   CL 106 07/16/2020   CREATININE 0.69 07/16/2020   BUN 11 07/16/2020  CO2 21 07/16/2020   TSH 0.938 07/16/2020      Assessment & Plan:   1. Juvenile idiopathic scoliosis of thoracolumbar region - AMB referral to orthopedics Patient will be referred to charity program at [facility].  Patient does not have insurance.  The scoliosis needs to be addressed as well as cervical spondylosis.  2. Depression, major, single episode, severe (HCC) - FLUoxetine (PROZAC) 20 MG tablet; Take 1 tablet (20 mg total) by mouth daily.  Dispense: 30 tablet; Refill: 3 Patient's depressio is uncontrolled with no medicines.   Anhedonia worse.  PHQ 9 was performed score 28.  No suicidal ideation. An individual care plan was established or reinforced today.  The patient's disease status was assessed using clinical findings on exam, labs, and or other diagnostic testing to determine patient's success in meeting treatment goals based on disease specific evidence-based guidelines and found to be worsening Recommendations include start fluoxetine which is a cheap generic at [employer].  Follow up one month.    Meds ordered this encounter  Medications   FLUoxetine (PROZAC) 20 MG tablet    Sig: Take 1 tablet (20 mg total) by mouth daily.    Dispense:  30 tablet    Refill:  3    Orders Placed This Encounter  Procedures   AMB referral to orthopedics     Follow-up: Return in about 1 month (around 09/20/2020).  An After Visit Summary was printed and given to the patient.  Brent Bulla [facility] [phone]

## 2020-10-08 DIAGNOSIS — G8929 Other chronic pain: Secondary | ICD-10-CM | POA: Insufficient documentation

## 2020-10-08 DIAGNOSIS — M549 Dorsalgia, unspecified: Secondary | ICD-10-CM | POA: Insufficient documentation

## 2020-10-08 HISTORY — DX: Other chronic pain: G89.29

## 2020-10-09 ENCOUNTER — Encounter: Payer: Self-pay | Admitting: Physician Assistant

## 2020-10-09 ENCOUNTER — Ambulatory Visit (INDEPENDENT_AMBULATORY_CARE_PROVIDER_SITE_OTHER): Payer: Self-pay | Admitting: Physician Assistant

## 2020-10-09 ENCOUNTER — Other Ambulatory Visit: Payer: Self-pay

## 2020-10-09 VITALS — BP 130/82 | HR 85 | Temp 98.4°F | Ht 65.0 in | Wt 260.0 lb

## 2020-10-09 DIAGNOSIS — R300 Dysuria: Secondary | ICD-10-CM

## 2020-10-09 DIAGNOSIS — F322 Major depressive disorder, single episode, severe without psychotic features: Secondary | ICD-10-CM

## 2020-10-09 DIAGNOSIS — M542 Cervicalgia: Secondary | ICD-10-CM

## 2020-10-09 HISTORY — DX: Dysuria: R30.0

## 2020-10-09 HISTORY — DX: Cervicalgia: M54.2

## 2020-10-09 LAB — POCT URINALYSIS DIP (CLINITEK)
Bilirubin, UA: NEGATIVE
Blood, UA: NEGATIVE
Glucose, UA: NEGATIVE mg/dL
Ketones, POC UA: NEGATIVE mg/dL
Leukocytes, UA: NEGATIVE
Nitrite, UA: NEGATIVE
POC PROTEIN,UA: NEGATIVE
Spec Grav, UA: 1.025 (ref 1.010–1.025)
Urobilinogen, UA: 0.2 E.U./dL
pH, UA: 5.5 (ref 5.0–8.0)

## 2020-10-09 NOTE — Progress Notes (Signed)
Subjective:  Patient ID: Angela Martin, female    DOB: May 09, 1981  Age: 39 y.o. MRN: [id number]  Chief Complaint  Patient presents with  . Neck pain    follow up    HPI  pt with history of cervicalgia - she saw ortho yesterday for history of scoliosis and neck pain - was told she would not be candidate for surgery and has been referred to PT and pain management  Pt was placed on prozac 20mg  qd for depression - states medication working well for her and mood has improved  Pt states for last 3 weeks has had some intermittent urinary urgency Current Outpatient Medications on File Prior to Visit  Medication Sig Dispense Refill  . FLUoxetine (PROZAC) 20 MG tablet Take 1 tablet (20 mg total) by mouth daily. 30 tablet 3  . gabapentin (NEURONTIN) 300 MG capsule Take 1 capsule (300 mg total) by mouth at bedtime. 30 capsule 2   No current facility-administered medications on file prior to visit.   Past Medical History:  Diagnosis Date  . Hypertension during pregnancy   . Migraines   . Scoliosis    Past Surgical History:  Procedure Laterality Date  . CESAREAN SECTION    . CHOLECYSTECTOMY    . plantar wart removal  Right    foot  . TONSILLECTOMY      Family History  Problem Relation Age of Onset  . Heart murmur Mother   . Heart disease Mother   . Cancer Father   . GER disease Father   . Colitis Father   . Ovarian cancer Sister        at age 23  . Other Sister        disc issues  . Congestive Heart Failure Maternal Grandmother   . Emphysema Paternal Grandmother    Social History   Socioeconomic History  . Marital status: Married    Spouse name: Not on file  . Number of children: Not on file  . Years of education: Not on file  . Highest education level: Not on file  Occupational History  . Not on file  Tobacco Use  . Smoking status: Never Smoker  . Smokeless tobacco: Never Used  Substance and Sexual Activity  . Alcohol use: Never  . Drug use: Yes    Types:  Marijuana  . Sexual activity: Yes  Other Topics Concern  . Not on file  Social History Narrative  . Not on file   Social Determinants of Health   Financial Resource Strain:   . Difficulty of Paying Living Expenses: Not on file  Food Insecurity:   . Worried About 36 in the Last Year: Not on file  . Ran Out of Food in the Last Year: Not on file  Transportation Needs:   . Lack of Transportation (Medical): Not on file  . Lack of Transportation (Non-Medical): Not on file  Physical Activity:   . Days of Exercise per Week: Not on file  . Minutes of Exercise per Session: Not on file  Stress:   . Feeling of Stress : Not on file  Social Connections:   . Frequency of Communication with Friends and Family: Not on file  . Frequency of Social Gatherings with Friends and Family: Not on file  . Attends Religious Services: Not on file  . Active Member of Clubs or Organizations: Not on file  . Attends Programme researcher, broadcasting/film/video Meetings: Not on file  . Marital Status: Not on  file    Review of Systems  CONSTITUTIONAL: Negative for chills, fatigue, fever, unintentional weight gain and unintentional weight loss.   CARDIOVASCULAR: Negative for chest pain, dizziness, palpitations and pedal edema.  RESPIRATORY: Negative for recent cough and dyspnea.  GASTROINTESTINAL: Negative for abdominal pain, acid reflux symptoms, constipation, diarrhea, nausea and vomiting.  MSK: see HPI INTEGUMENTARY: Negative for rash.  NEUROLOGICAL: Negative for dizziness and headaches.  PSYCHIATRIC: see HPI     Objective:  BP 130/82 (BP Location: Left Arm, Patient Position: Sitting, Cuff Size: Normal)   Pulse 85   Temp 98.4 F (36.9 C) (Temporal)   Ht 5\' 5"  (1.651 m)   Wt 260 lb (117.9 kg)   SpO2 99%   BMI 43.27 kg/m   BP/Weight 10/09/2020 08/20/2020 07/16/2020  Systolic BP 130 138 120  Diastolic BP 82 82 78  Wt. (Lbs) 260 258.2 251.8  BMI 43.27 42.97 41.9    Physical Exam PHYSICAL EXAM:   VS:  BP 130/82 (BP Location: Left Arm, Patient Position: Sitting, Cuff Size: Normal)   Pulse 85   Temp 98.4 F (36.9 C) (Temporal)   Ht 5\' 5"  (1.651 m)   Wt 260 lb (117.9 kg)   SpO2 99%   BMI 43.27 kg/m   GEN: Well nourished, well developed, in no acute distress   Cardiac: RRR; no murmurs, rubs, or gallops,no edema -  Respiratory:  normal respiratory rate and pattern with no distress - normal breath sounds with no rales, rhonchi, wheezes or rubs MS: no deformity or atrophy  Skin: warm and dry, no rash  Neuro:  Alert and Oriented x 3, Strength and sensation are intact - CN II-Xii grossly intact Psych: euthymic mood, appropriate affect and demeanor  Office Visit on 10/09/2020  Component Date Value Ref Range Status  . Glucose, UA 10/09/2020 negative  negative mg/dL Final  . Bilirubin, UA 10/09/2020 negative  negative Final  . Ketones, POC UA 10/09/2020 negative  negative mg/dL Final  . Spec Grav, UA 10/09/2020 1.025  1.010 - 1.025 Final  . Blood, UA 10/09/2020 negative  negative Final  . pH, UA 10/09/2020 5.5  5.0 - 8.0 Final  . POC PROTEIN,UA 10/09/2020 negative  negative, trace Final  . Urobilinogen, UA 10/09/2020 0.2  0.2 or 1.0 E.U./dL Final  . Nitrite, UA 14/11/2019 Negative  Negative Final  . Leukocytes, UA 10/09/2020 Negative  Negative Final     Diabetic Foot Exam - Simple   No data filed       Lab Results  Component Value Date   WBC 7.1 07/16/2020   HGB 13.0 07/16/2020   HCT 40.2 07/16/2020   PLT 322 07/16/2020   GLUCOSE 77 07/16/2020   ALT 14 07/16/2020   AST 14 07/16/2020   NA 139 07/16/2020   K 4.0 07/16/2020   CL 106 07/16/2020   CREATININE 0.69 07/16/2020   BUN 11 07/16/2020   CO2 21 07/16/2020   TSH 0.938 07/16/2020      Assessment & Plan:   1. Cervicalgia Do PT and follow up with pain management as scheduled 2. Depression, major, single episode, severe (HCC) Continue current meds as directed 3. Dysuria - POCT URINALYSIS DIP (CLINITEK)  ua  normal - recommend to decrease carbonated drinks  No orders of the defined types were placed in this encounter.   Orders Placed This Encounter  Procedures  . POCT URINALYSIS DIP (CLINITEK)        Follow-up: Return in about 3 months (around 01/07/2021) for med follow  up.  An After Visit Summary was printed and given to the patient.  Jettie Pagan [facility] [phone]

## 2021-01-07 ENCOUNTER — Ambulatory Visit (INDEPENDENT_AMBULATORY_CARE_PROVIDER_SITE_OTHER): Payer: Self-pay | Admitting: Physician Assistant

## 2021-01-07 ENCOUNTER — Encounter: Payer: Self-pay | Admitting: Physician Assistant

## 2021-01-07 ENCOUNTER — Other Ambulatory Visit: Payer: Self-pay

## 2021-01-07 VITALS — BP 128/80 | HR 80 | Temp 97.3°F | Ht 65.0 in | Wt 265.0 lb

## 2021-01-07 DIAGNOSIS — F322 Major depressive disorder, single episode, severe without psychotic features: Secondary | ICD-10-CM

## 2021-01-07 DIAGNOSIS — M542 Cervicalgia: Secondary | ICD-10-CM

## 2021-01-07 MED ORDER — FLUOXETINE HCL 20 MG PO TABS: 20.0000 mg | ORAL_TABLET | Freq: Every day | ORAL | 5 refills | Status: DC

## 2021-01-07 NOTE — Progress Notes (Signed)
Subjective:  Patient ID: Angela Martin, female    DOB: [birth year]  Age: 40 y.o. MRN: [id number]  Chief Complaint  Patient presents with  . Neck pain     Depression follow up    HPI  pt with history of cervicalgia - currently seeing ortho for management  Pt states she has been out of her prozac that she takes for depression for the past 3 weeks - she did not call our office for refill and advised to do so next time She states while she takes the medication she was doing well and it helped greatly with her mood/symptoms Current Outpatient Medications on File Prior to Visit  Medication Sig Dispense Refill  . gabapentin (NEURONTIN) 300 MG capsule Take 1 capsule (300 mg total) by mouth at bedtime. 30 capsule 2  . traMADol (ULTRAM) 50 MG tablet tramadol 50 mg tablet  TAKE 1 TABLET BY MOUTH 3 TIMES A DAY AS NEEDED.     No current facility-administered medications on file prior to visit.   Past Medical History:  Diagnosis Date  . Hypertension during pregnancy   . Migraines   . Scoliosis    Past Surgical History:  Procedure Laterality Date  . CESAREAN SECTION    . CHOLECYSTECTOMY    . plantar wart removal  Right    foot  . TONSILLECTOMY      Family History  Problem Relation Age of Onset  . Heart murmur Mother   . Heart disease Mother   . Cancer Father   . GER disease Father   . Colitis Father   . Ovarian cancer Sister        at age 78  . Other Sister        disc issues  . Congestive Heart Failure Maternal Grandmother   . Emphysema Paternal Grandmother    Social History   Socioeconomic History  . Marital status: Married    Spouse name: Not on file  . Number of children: Not on file  . Years of education: Not on file  . Highest education level: Not on file  Occupational History  . Not on file  Tobacco Use  . Smoking status: Never Smoker  . Smokeless tobacco: Never Used  Substance and Sexual Activity  . Alcohol use: Never  . Drug use: Yes    Types:  Marijuana  . Sexual activity: Yes  Other Topics Concern  . Not on file  Social History Narrative  . Not on file   Social Determinants of Health   Financial Resource Strain: Not on file  Food Insecurity: Not on file  Transportation Needs: Not on file  Physical Activity: Not on file  Stress: Not on file  Social Connections: Not on file    Review of Systems  Psychiatric/Behavioral: Positive for depression.   CONSTITUTIONAL: Negative for chills, fatigue, fever, unintentional weight gain and unintentional weight loss.  CARDIOVASCULAR: Negative for chest pain, dizziness, palpitations and pedal edema.  RESPIRATORY: Negative for recent cough and dyspnea.  PSYCHIATRIC: Negative for sleep disturbance and to question depression screen.  Negative for depression, negative for anhedonia.          Objective:  BP 128/80 (BP Location: Left Arm, Patient Position: Sitting, Cuff Size: Large)   Pulse 80   Temp (!) 97.3 F (36.3 C) (Temporal)   Ht 5\' 5"  (1.651 m)   Wt 265 lb (120.2 kg)   SpO2 99%   BMI 44.10 kg/m   BP/Weight 01/07/2021 10/09/2020 08/20/2020  Systolic  BP 128 130 138  Diastolic BP 80 82 82  Wt. (Lbs) 265 260 258.2  BMI 44.1 43.27 42.97    Physical Exam PHYSICAL EXAM:   VS: BP 128/80 (BP Location: Left Arm, Patient Position: Sitting, Cuff Size: Large)   Pulse 80   Temp (!) 97.3 F (36.3 C) (Temporal)   Ht 5\' 5"  (1.651 m)   Wt 265 lb (120.2 kg)   SpO2 99%   BMI 44.10 kg/m   PHYSICAL EXAM:   VS: BP 128/80 (BP Location: Left Arm, Patient Position: Sitting, Cuff Size: Large)   Pulse 80   Temp (!) 97.3 F (36.3 C) (Temporal)   Ht 5\' 5"  (1.651 m)   Wt 265 lb (120.2 kg)   SpO2 99%   BMI 44.10 kg/m   GEN: Well nourished, well developed, in no acute distress  Cardiac: RRR; no murmurs, rubs, or gallops,no edema -  Respiratory:  normal respiratory rate and pattern with no distress - normal breath sounds with no rales, rhonchi, wheezes or rubs  Psych: euthymic  mood, appropriate affect and demeanor   No visits with results within 1 Day(s) from this visit.  Latest known visit with results is:  Office Visit on 10/09/2020  Component Date Value Ref Range Status  . Glucose, UA 10/09/2020 negative  negative mg/dL Final  . Bilirubin, UA 10/09/2020 negative  negative Final  . Ketones, POC UA 10/09/2020 negative  negative mg/dL Final  . Spec Grav, UA 10/09/2020 1.025  1.010 - 1.025 Final  . Blood, UA 10/09/2020 negative  negative Final  . pH, UA 10/09/2020 5.5  5.0 - 8.0 Final  . POC PROTEIN,UA 10/09/2020 negative  negative, trace Final  . Urobilinogen, UA 10/09/2020 0.2  0.2 or 1.0 E.U./dL Final  . Nitrite, UA 14/11/2019 Negative  Negative Final  . Leukocytes, UA 10/09/2020 Negative  Negative Final     Diabetic Foot Exam - Simple   No data filed      Lab Results  Component Value Date   WBC 7.1 07/16/2020   HGB 13.0 07/16/2020   HCT 40.2 07/16/2020   PLT 322 07/16/2020   GLUCOSE 77 07/16/2020   ALT 14 07/16/2020   AST 14 07/16/2020   NA 139 07/16/2020   K 4.0 07/16/2020   CL 106 07/16/2020   CREATININE 0.69 07/16/2020   BUN 11 07/16/2020   CO2 21 07/16/2020   TSH 0.938 07/16/2020      Assessment & Plan:   1. Cervicalgia Follow up with ortho as directed 2. Depression, major, single episode, severe (HCC) Continue current meds as directed- refill of prozac given  Meds ordered this encounter  Medications  . FLUoxetine (PROZAC) 20 MG tablet    Sig: Take 1 tablet (20 mg total) by mouth daily.    Dispense:  30 tablet    Refill:  5    Order Specific Question:   Supervising Provider    Answer:   09/15/2020    No orders of the defined types were placed in this encounter.       Follow-up: Return in about 6 months (around 07/10/2021) for chronic follow up.  An After Visit Summary was printed and given to the patient.  Corey [facility] [phone]

## 2021-01-09 ENCOUNTER — Other Ambulatory Visit: Payer: Self-pay

## 2021-01-09 DIAGNOSIS — G441 Vascular headache, not elsewhere classified: Secondary | ICD-10-CM

## 2021-01-09 MED ORDER — GABAPENTIN 300 MG PO CAPS: 300.0000 mg | ORAL_CAPSULE | Freq: Every day | ORAL | 2 refills | Status: DC

## 2021-04-09 ENCOUNTER — Ambulatory Visit (INDEPENDENT_AMBULATORY_CARE_PROVIDER_SITE_OTHER): Payer: Self-pay | Admitting: Physician Assistant

## 2021-04-09 ENCOUNTER — Encounter: Payer: Self-pay | Admitting: Physician Assistant

## 2021-04-09 ENCOUNTER — Other Ambulatory Visit: Payer: Self-pay

## 2021-04-09 VITALS — BP 134/74 | HR 83 | Temp 97.3°F | Ht 66.0 in | Wt 262.0 lb

## 2021-04-09 DIAGNOSIS — M545 Low back pain, unspecified: Secondary | ICD-10-CM

## 2021-04-09 LAB — POCT URINALYSIS DIP (CLINITEK)
Bilirubin, UA: NEGATIVE
Blood, UA: NEGATIVE
Glucose, UA: NEGATIVE mg/dL
Ketones, POC UA: NEGATIVE mg/dL
Leukocytes, UA: NEGATIVE
Nitrite, UA: NEGATIVE
POC PROTEIN,UA: NEGATIVE
Spec Grav, UA: 1.02 (ref 1.010–1.025)
Urobilinogen, UA: 0.2 E.U./dL
pH, UA: 6 (ref 5.0–8.0)

## 2021-04-09 MED ORDER — CYCLOBENZAPRINE HCL 5 MG PO TABS: 5.0000 mg | ORAL_TABLET | Freq: Two times a day (BID) | ORAL | 0 refills | Status: DC | PRN

## 2021-04-09 NOTE — Progress Notes (Signed)
Subjective:  Patient ID: Angela Martin, female    DOB: 03-Sep-1981  Age: 40 y.o. MRN: [id number]  Chief Complaint  Patient presents with  .       Marland Kitchen Back Pain    X 2-3 months c/o stiffness, achy, throbbing pain that will not go away. When bending over pain worsens and is sharp.    HPI  pt complains of low back pain bilaterally - has been going on several months - states feels tight and achey and worse with certain movements especially bending She is currently on tramadol and gabapentin for chronic neck pain  From pain clinic - states those meds help somewhat She states her urine has been dark and with strong odor but denies dysuria Current Outpatient Medications on File Prior to Visit  Medication Sig Dispense Refill  . FLUoxetine (PROZAC) 20 MG tablet Take 1 tablet (20 mg total) by mouth daily. 30 tablet 5  . gabapentin (NEURONTIN) 300 MG capsule Take 1 capsule (300 mg total) by mouth at bedtime. 30 capsule 2  . traMADol (ULTRAM) 50 MG tablet tramadol 50 mg tablet  TAKE 1 TABLET BY MOUTH 3 TIMES A DAY AS NEEDED.     No current facility-administered medications on file prior to visit.   Past Medical History:  Diagnosis Date  . Hypertension during pregnancy   . Migraines   . Scoliosis    Past Surgical History:  Procedure Laterality Date  . CESAREAN SECTION    . CHOLECYSTECTOMY    . plantar wart removal  Right    foot  . TONSILLECTOMY      Family History  Problem Relation Age of Onset  . Heart murmur Mother   . Heart disease Mother   . Cancer Father   . GER disease Father   . Colitis Father   . Ovarian cancer Sister        at age 66  . Other Sister        disc issues  . Congestive Heart Failure Maternal Grandmother   . Emphysema Paternal Grandmother    Social History   Socioeconomic History  . Marital status: Married    Spouse name: Not on file  . Number of children: Not on file  . Years of education: Not on file  . Highest education level: Not on file   Occupational History  . Not on file  Tobacco Use  . Smoking status: Never Smoker  . Smokeless tobacco: Never Used  Substance and Sexual Activity  . Alcohol use: Never  . Drug use: Yes    Types: Marijuana  . Sexual activity: Yes  Other Topics Concern  . Not on file  Social History Narrative  . Not on file   Social Determinants of Health   Financial Resource Strain: Not on file  Food Insecurity: Not on file  Transportation Needs: Not on file  Physical Activity: Not on file  Stress: Not on file  Social Connections: Not on file    Review of Systems CONSTITUTIONAL: Negative for chills, fatigue, fever, unintentional weight gain and unintentional weight loss.  CARDIOVASCULAR: Negative for chest pain, dizziness, palpitations and pedal edema.  RESPIRATORY: Negative for recent cough and dyspnea.  GASTROINTESTINAL: Negative for abdominal pain, acid reflux symptoms, constipation, diarrhea, nausea and vomiting.  MSK: see HPI PSYCHIATRIC: Negative for sleep disturbance and to question depression screen.  Negative for depression, negative for anhedonia.       Objective:  BP 134/74   Pulse 83   Temp (!)  97.3 F (36.3 C)   Ht 5\' 6"  (1.676 m)   Wt 262 lb (118.8 kg)   SpO2 99%   BMI 42.29 kg/m   BP/Weight 04/09/2021 01/07/2021 10/09/2020  Systolic BP 134 128 130  Diastolic BP 74 80 82  Wt. (Lbs) 262 265 260  BMI 42.29 44.1 43.27    Physical Exam PHYSICAL EXAM:   VS: BP 134/74   Pulse 83   Temp (!) 97.3 F (36.3 C)   Ht 5\' 6"  (1.676 m)   Wt 262 lb (118.8 kg)   SpO2 99%   BMI 42.29 kg/m   GEN: Well nourished, well developed, in no acute distress  Cardiac: RRR; no murmurs, rubs, or gallops, Respiratory:  normal respiratory rate and pattern with no distress - normal breath sounds with no rales, rhonchi, wheezes or rubs GI: normal bowel sounds, no masses or tenderness MS: no deformity or atrophy - subjective tenderness across lower back/flank bilaterally Skin: warm and  dry, no rash  Psych: euthymic mood, appropriate affect and demeanor  Office Visit on 04/09/2021  Component Date Value Ref Range Status  . Glucose, UA 04/09/2021 negative  negative mg/dL Final  . Bilirubin, UA 04/09/2021 negative  negative Final  . Ketones, POC UA 04/09/2021 negative  negative mg/dL Final  . Spec Grav, UA 04/09/2021 1.020  1.010 - 1.025 Final  . Blood, UA 04/09/2021 negative  negative Final  . pH, UA 04/09/2021 6.0  5.0 - 8.0 Final  . POC PROTEIN,UA 04/09/2021 negative  negative, trace Final  . Urobilinogen, UA 04/09/2021 0.2  0.2 or 1.0 E.U./dL Final  . Nitrite, UA 06/09/2021 Negative  Negative Final  . Leukocytes, UA 04/09/2021 Negative  Negative Final    Diabetic Foot Exam - Simple   No data filed      Lab Results  Component Value Date   WBC 7.1 07/16/2020   HGB 13.0 07/16/2020   HCT 40.2 07/16/2020   PLT 322 07/16/2020   GLUCOSE 77 07/16/2020   ALT 14 07/16/2020   AST 14 07/16/2020   NA 139 07/16/2020   K 4.0 07/16/2020   CL 106 07/16/2020   CREATININE 0.69 07/16/2020   BUN 11 07/16/2020   CO2 21 07/16/2020   TSH 0.938 07/16/2020      Assessment & Plan:   1. Acute bilateral low back pain without sciatica - POCT URINALYSIS DIP (CLINITEK) - cyclobenzaprine (FLEXERIL) 5 MG tablet; Take 1 tablet (5 mg total) by mouth 2 (two) times daily as needed for muscle spasms.  Dispense: 60 tablet; Refill: 0    Meds ordered this encounter  Medications  . cyclobenzaprine (FLEXERIL) 5 MG tablet    Sig: Take 1 tablet (5 mg total) by mouth 2 (two) times daily as needed for muscle spasms.    Dispense:  60 tablet    Refill:  0    Order Specific Question:   Supervising Provider    Answer11/05/2020    Orders Placed This Encounter  Procedures  . POCT URINALYSIS DIP (CLINITEK)     Follow-up: Return if symptoms worsen or fail to improve. Pt given patient finance number -- recommend to schedule for checkup/labwork An After Visit Summary was  printed and given to the patient.  09/15/2020 [facility] [phone]

## 2021-04-10 ENCOUNTER — Ambulatory Visit: Payer: Self-pay | Admitting: Legal Medicine

## 2021-04-14 ENCOUNTER — Other Ambulatory Visit: Payer: Self-pay

## 2021-04-14 DIAGNOSIS — G441 Vascular headache, not elsewhere classified: Secondary | ICD-10-CM

## 2021-04-14 MED ORDER — GABAPENTIN 300 MG PO CAPS: 300.0000 mg | ORAL_CAPSULE | Freq: Every day | ORAL | 2 refills | Status: DC

## 2021-04-28 ENCOUNTER — Encounter: Payer: Self-pay | Admitting: Legal Medicine

## 2021-04-28 ENCOUNTER — Other Ambulatory Visit: Payer: Self-pay

## 2021-04-28 ENCOUNTER — Ambulatory Visit: Payer: Self-pay | Admitting: Legal Medicine

## 2021-04-28 VITALS — BP 124/76 | HR 74 | Temp 97.1°F | Resp 15 | Ht 66.0 in | Wt 271.0 lb

## 2021-04-28 DIAGNOSIS — F331 Major depressive disorder, recurrent, moderate: Secondary | ICD-10-CM

## 2021-04-28 DIAGNOSIS — F410 Panic disorder [episodic paroxysmal anxiety] without agoraphobia: Secondary | ICD-10-CM

## 2021-04-28 DIAGNOSIS — G5603 Carpal tunnel syndrome, bilateral upper limbs: Secondary | ICD-10-CM

## 2021-04-28 DIAGNOSIS — M7552 Bursitis of left shoulder: Secondary | ICD-10-CM

## 2021-04-28 DIAGNOSIS — M797 Fibromyalgia: Secondary | ICD-10-CM

## 2021-04-28 HISTORY — DX: Bursitis of left shoulder: M75.52

## 2021-04-28 HISTORY — DX: Panic disorder (episodic paroxysmal anxiety): F41.0

## 2021-04-28 HISTORY — DX: Carpal tunnel syndrome, bilateral upper limbs: G56.03

## 2021-04-28 HISTORY — DX: Fibromyalgia: M79.7

## 2021-04-28 MED ORDER — DULOXETINE HCL 60 MG PO CPEP
60.0000 mg | ORAL_CAPSULE | Freq: Every day | ORAL | 3 refills | Status: DC
Start: 2021-04-28 — End: 2021-05-28

## 2021-04-28 NOTE — Progress Notes (Signed)
Established Patient Office Visit  Subjective:  Patient ID: Angela Martin, female    DOB: 05/04/1981  Age: 40 y.o. MRN: [id number]  CC:  Chief Complaint  Patient presents with   Back Pain   Numbness    Both arms specially on the left arm. It is getting worse since one year ago.     HPI Angela Martin presents for working a [employer].  She does overhead work.  She is losing sensation in hands.  No NCS.  No MRI neck.  Could not afford.  She is increasingly having depression. She is on fluoxetine. Past Medical History:  Diagnosis Date   Hypertension during pregnancy    Migraines    Scoliosis     Past Surgical History:  Procedure Laterality Date   CESAREAN SECTION     CHOLECYSTECTOMY     plantar wart removal  Right    foot   TONSILLECTOMY      Family History  Problem Relation Age of Onset   Heart murmur Mother    Heart disease Mother    Cancer Father    GER disease Father    Colitis Father    Ovarian cancer Sister        at age 39   Other Sister        disc issues   Congestive Heart Failure Maternal Grandmother    Emphysema Paternal Grandmother     Social History   Socioeconomic History   Marital status: Married    Spouse name: Not on file   Number of children: Not on file   Years of education: Not on file   Highest education level: Not on file  Occupational History   Not on file  Tobacco Use   Smoking status: Never   Smokeless tobacco: Never  Substance and Sexual Activity   Alcohol use: Never   Drug use: Yes    Types: Marijuana   Sexual activity: Yes  Other Topics Concern   Not on file  Social History Narrative   Not on file   Social Determinants of Health   Financial Resource Strain: Not on file  Food Insecurity: Not on file  Transportation Needs: Not on file  Physical Activity: Not on file  Stress: Not on file  Social Connections: Not on file  Intimate Partner Violence: Not on file    Outpatient Medications Prior to Visit   Medication Sig Dispense Refill   cyclobenzaprine (FLEXERIL) 5 MG tablet Take 1 tablet (5 mg total) by mouth 2 (two) times daily as needed for muscle spasms. 60 tablet 0   gabapentin (NEURONTIN) 300 MG capsule Take 1 capsule (300 mg total) by mouth at bedtime. 30 capsule 2   FLUoxetine (PROZAC) 20 MG tablet Take 1 tablet (20 mg total) by mouth daily. 30 tablet 5   traMADol (ULTRAM) 50 MG tablet tramadol 50 mg tablet  TAKE 1 TABLET BY MOUTH 3 TIMES A DAY AS NEEDED.     No facility-administered medications prior to visit.    Allergies  Allergen Reactions   Morphine Anaphylaxis    Other reaction(s): Facial Swelling, Flushing   Oxycodone Anaphylaxis, Other (See Comments), Itching and Nausea Only   Morphine And Related    Other     SULFA DRUGS    ROS Review of Systems  Constitutional: Negative.  Negative for activity change and appetite change.  HENT:  Negative for congestion.   Eyes:  Negative for visual disturbance.  Respiratory:  Negative for chest tightness  and shortness of breath.   Gastrointestinal:  Negative for abdominal distention and abdominal pain.  Genitourinary:  Negative for difficulty urinating and dysuria.  Musculoskeletal:  Positive for back pain and neck pain.  Neurological:  Positive for numbness (hands.).     Objective:    Physical Exam Vitals reviewed.  Constitutional:      Appearance: Normal appearance.  HENT:     Head: Normocephalic.     Right Ear: Tympanic membrane normal.     Left Ear: Tympanic membrane normal.     Nose: Nose normal.     Mouth/Throat:     Mouth: Mucous membranes are dry.  Cardiovascular:     Rate and Rhythm: Normal rate and regular rhythm.     Pulses: Normal pulses.     Heart sounds: Normal heart sounds. No murmur heard.   No gallop.  Pulmonary:     Effort: Pulmonary effort is normal. No respiratory distress.     Breath sounds: Normal breath sounds. No wheezing.  Abdominal:     General: Abdomen is flat. Bowel sounds are  normal.     Palpations: Abdomen is soft.  Musculoskeletal:       Arms:     Cervical back: Normal range of motion and neck supple.     Comments: Diffuse tender points all 4 extremeties  Skin:    General: Skin is warm.     Capillary Refill: Capillary refill takes less than 2 seconds.  Neurological:     General: No focal deficit present.     Mental Status: She is alert and oriented to person, place, and time.     Motor: Weakness (hands and dropping) present.   Depression screen [facility] 2/9 04/28/2021 01/07/2021 08/20/2020 08/20/2020  Decreased Interest 3 3 3  0  Down, Depressed, Hopeless 3 3 3  0  PHQ - 2 Score 6 6 6  0  Altered sleeping 3 3 3  -  Tired, decreased energy 3 3 3  -  Change in appetite 3 3 3  -  Feeling bad or failure about yourself  3 2 2  -  Trouble concentrating 2 3 3  -  Moving slowly or fidgety/restless 3 1 2  -  Suicidal thoughts 0 0 1 -  PHQ-9 Score 23 21 23  -  Difficult doing work/chores Extremely dIfficult - Extremely dIfficult -    BP 124/76   Pulse 74   Temp (!) 97.1 F (36.2 C)   Resp 15   Ht 5\' 6"  (1.676 m)   Wt 271 lb (122.9 kg)   BMI 43.74 kg/m  Wt Readings from Last 3 Encounters:  04/28/21 271 lb (122.9 kg)  04/09/21 262 lb (118.8 kg)  01/07/21 265 lb (120.2 kg)     Health Maintenance Due  Topic Date Due   PAP SMEAR-Modifier  Never done    There are no preventive care reminders to display for this patient.  Lab Results  Component Value Date   TSH 0.938 07/16/2020   Lab Results  Component Value Date   WBC 7.1 07/16/2020   HGB 13.0 07/16/2020   HCT 40.2 07/16/2020   MCV 89 07/16/2020   PLT 322 07/16/2020   Lab Results  Component Value Date   NA 139 07/16/2020   K 4.0 07/16/2020   CO2 21 07/16/2020   GLUCOSE 77 07/16/2020   BUN 11 07/16/2020   CREATININE 0.69 07/16/2020   BILITOT 0.4 07/16/2020   ALKPHOS 69 07/16/2020   AST 14 07/16/2020   ALT 14 07/16/2020   PROT 7.0  07/16/2020   ALBUMIN 4.1 07/16/2020   CALCIUM 9.0 07/16/2020    No results found for: CHOL No results found for: HDL No results found for: LDLCALC No results found for: TRIG No results found for: CHOLHDL No results found for: HYWV3X    Assessment & Plan:   Diagnoses and all orders for this visit: Moderate recurrent major depression (HCC) -     DULoxetine (CYMBALTA) 60 MG capsule; Take 1 capsule (60 mg total) by mouth daily. Patient's depression is uncontrolled with cybalta.   Anhedonia worse.  PHQ 9 was performed score 23. An individual care plan was established or reinforced today.  The patient's disease status was assessed using clinical findings on exam, labs, and or other diagnostic testing to determine patient's success in meeting treatment goals based on disease specific evidence-based guidelines and found to be worsening Recommendations include change medicine to cybalta , stop fluoxetine   Fibromyalgia Patient has multiple tender points and chronic pain.  Use cymbalta, get her in touch with fibromyalgia  , association  Bilateral carpal tunnel syndrome -     Motor nerve conduction test w/ f-wave study Patient has bilateral tinel at carpal tunnel, we discussed wrist splints and [place]  Panic disorder Patient is experiencing panic attacks with the feeling she is dying and , dyspnea and pain.  Acute bursitis of left shoulder Patient has acute bursitis left shoulder  with liited flexion, abduction and rotation.  Shoulder injected and Rom exercises given  After consent was obtained, using sterile technique the left shoulder was prepped and plain Ethyl Chloride was used as local anesthetic. The joint was entered and .  Steroid 80 mg and 3 ml plain Lidocaine was then injected and the needle withdrawn.  The procedure was well tolerated.  The patient is asked to continue to rest the joint for a few more days before resuming regular activities.  It may be more painful for the first 1-2 days.  Watch for fever, or increased swelling or persistent pain in  the joint. Call or return to clinic prn if such symptoms occur or there is failure to improve as anticipated.  30 minute visit    Follow-up: Return in about 1 month (around 05/28/2021) for sally.    Brent Bulla, MD

## 2021-05-28 ENCOUNTER — Encounter: Payer: Self-pay | Admitting: Physician Assistant

## 2021-05-28 ENCOUNTER — Ambulatory Visit (INDEPENDENT_AMBULATORY_CARE_PROVIDER_SITE_OTHER): Payer: Self-pay | Admitting: Physician Assistant

## 2021-05-28 ENCOUNTER — Other Ambulatory Visit: Payer: Self-pay

## 2021-05-28 VITALS — BP 124/72 | HR 83 | Temp 97.0°F | Ht 66.0 in | Wt 261.4 lb

## 2021-05-28 DIAGNOSIS — M797 Fibromyalgia: Secondary | ICD-10-CM

## 2021-05-28 DIAGNOSIS — F322 Major depressive disorder, single episode, severe without psychotic features: Secondary | ICD-10-CM

## 2021-05-28 MED ORDER — DULOXETINE HCL 30 MG PO CPEP: 30.0000 mg | ORAL_CAPSULE | Freq: Every day | ORAL | 2 refills | Status: DC

## 2021-05-28 MED ORDER — GABAPENTIN 100 MG PO CAPS: 100.0000 mg | ORAL_CAPSULE | Freq: Three times a day (TID) | ORAL | 2 refills | Status: DC

## 2021-05-28 NOTE — Progress Notes (Signed)
Subjective:  Patient ID: Angela Martin, female    DOB: 02/18/1981  Age: 40 y.o. MRN: [id number]  Chief Complaint  Patient presents with   Depression    HPI  Pt in for follow up of depression and fibromyalgia - she was started on cymbalta 60mg  qd and stopped prozac.  She states she thinks has been helping but staying nauseated.  Recommend to try splitting dose.  She also uses gabapentin for her symptoms of fibromyalgia and would like to try a split dose with that as well Current Outpatient Medications on File Prior to Visit  Medication Sig Dispense Refill   cyclobenzaprine (FLEXERIL) 5 MG tablet Take 1 tablet (5 mg total) by mouth 2 (two) times daily as needed for muscle spasms. 60 tablet 0   No current facility-administered medications on file prior to visit.   Past Medical History:  Diagnosis Date   Hypertension during pregnancy    Migraines    Scoliosis    Past Surgical History:  Procedure Laterality Date   CESAREAN SECTION     CHOLECYSTECTOMY     plantar wart removal  Right    foot   TONSILLECTOMY      Family History  Problem Relation Age of Onset   Heart murmur Mother    Heart disease Mother    Cancer Father    GER disease Father    Colitis Father    Ovarian cancer Sister        at age 66   Other Sister        disc issues   Congestive Heart Failure Maternal Grandmother    Emphysema Paternal Grandmother    Social History   Socioeconomic History   Marital status: Married    Spouse name: Not on file   Number of children: Not on file   Years of education: Not on file   Highest education level: Not on file  Occupational History   Not on file  Tobacco Use   Smoking status: Never   Smokeless tobacco: Never  Substance and Sexual Activity   Alcohol use: Never   Drug use: Yes    Types: Marijuana   Sexual activity: Yes  Other Topics Concern   Not on file  Social History Narrative   Not on file   Social Determinants of Health   Financial Resource  Strain: Not on file  Food Insecurity: Not on file  Transportation Needs: Not on file  Physical Activity: Not on file  Stress: Not on file  Social Connections: Not on file    Review of Systems CONSTITUTIONAL: Negative for chills, fatigue, fever, unintentional weight gain and unintentional weight loss.  CARDIOVASCULAR: Negative for chest pain, dizziness, palpitations and pedal edema.  RESPIRATORY: Negative for recent cough and dyspnea.  MSK: see HPI PSYCHIATRIC: Negative for sleep disturbance and to question depression screen.  Negative for depression, negative for anhedonia. However scored 20 on PCQ but improved from last visit      Objective:  BP 124/72 (BP Location: Right Arm, Patient Position: Sitting, Cuff Size: Normal)   Pulse 83   Temp (!) 97 F (36.1 C) (Temporal)   Ht 5\' 6"  (1.676 m)   Wt 261 lb 6.4 oz (118.6 kg)   SpO2 99%   BMI 42.19 kg/m   BP/Weight 05/28/2021 04/28/2021 04/09/2021  Systolic BP 124 124 134  Diastolic BP 72 76 74  Wt. (Lbs) 261.4 271 262  BMI 42.19 43.74 42.29    Physical Exam PHYSICAL EXAM:  VS: BP 124/72 (BP Location: Right Arm, Patient Position: Sitting, Cuff Size: Normal)   Pulse 83   Temp (!) 97 F (36.1 C) (Temporal)   Ht 5\' 6"  (1.676 m)   Wt 261 lb 6.4 oz (118.6 kg)   SpO2 99%   BMI 42.19 kg/m   GEN: Well nourished, well developed, in no acute distress  Cardiac: RRR; no murmurs, rubs, or gallops, Respiratory:  normal respiratory rate and pattern with no distress - normal breath sounds with no rales, rhonchi, wheezes or rubs Psych: euthymic mood, appropriate affect and demeanor  Diabetic Foot Exam - Simple   No data filed      Lab Results  Component Value Date   WBC 7.1 07/16/2020   HGB 13.0 07/16/2020   HCT 40.2 07/16/2020   PLT 322 07/16/2020   GLUCOSE 77 07/16/2020   ALT 14 07/16/2020   AST 14 07/16/2020   NA 139 07/16/2020   K 4.0 07/16/2020   CL 106 07/16/2020   CREATININE 0.69 07/16/2020   BUN 11 07/16/2020    CO2 21 07/16/2020   TSH 0.938 07/16/2020      Assessment & Plan:   1. Depression, major, single episode, severe (HCC) - gabapentin (NEURONTIN) 100 MG capsule; Take 1 capsule (100 mg total) by mouth 3 (three) times daily.  Dispense: 90 capsule; Refill: 2 - DULoxetine (CYMBALTA) 30 MG capsule; Take 1 capsule (30 mg total) by mouth daily.  Dispense: 60 capsule; Refill: 2  2. Fibromyalgia - gabapentin (NEURONTIN) 100 MG capsule; Take 1 capsule (100 mg total) by mouth 3 (three) times daily.  Dispense: 90 capsule; Refill: 2 - DULoxetine (CYMBALTA) 30 MG capsule; Take 1 capsule (30 mg total) by mouth daily.  Dispense: 60 capsule; Refill: 2    Meds ordered this encounter  Medications   gabapentin (NEURONTIN) 100 MG capsule    Sig: Take 1 capsule (100 mg total) by mouth 3 (three) times daily.    Dispense:  90 capsule    Refill:  2    Order Specific Question:   Supervising Provider    Answer:   09/15/2020   DULoxetine (CYMBALTA) 30 MG capsule    Sig: Take 1 capsule (30 mg total) by mouth daily.    Dispense:  60 capsule    Refill:  2    Order Specific Question:   Supervising Provider    Answer:   Corey Harold    No orders of the defined types were placed in this encounter.    Follow-up: Return in about 3 months (around 08/28/2021) for follow up.  An After Visit Summary was printed and given to the patient.  08/30/2021 [facility] [phone]

## 2021-07-02 ENCOUNTER — Encounter: Payer: Self-pay | Admitting: Physician Assistant

## 2021-07-10 ENCOUNTER — Ambulatory Visit: Payer: Self-pay | Admitting: Physician Assistant

## 2021-07-10 ENCOUNTER — Ambulatory Visit (INDEPENDENT_AMBULATORY_CARE_PROVIDER_SITE_OTHER): Payer: Self-pay | Admitting: Physician Assistant

## 2021-07-10 ENCOUNTER — Other Ambulatory Visit: Payer: Self-pay

## 2021-07-10 ENCOUNTER — Encounter: Payer: Self-pay | Admitting: Physician Assistant

## 2021-07-10 VITALS — BP 126/84 | HR 90 | Temp 97.4°F | Ht 66.0 in | Wt 260.0 lb

## 2021-07-10 DIAGNOSIS — N926 Irregular menstruation, unspecified: Secondary | ICD-10-CM

## 2021-07-10 DIAGNOSIS — R102 Pelvic and perineal pain: Secondary | ICD-10-CM

## 2021-07-10 LAB — POCT URINE PREGNANCY: Preg Test, Ur: NEGATIVE

## 2021-07-10 MED ORDER — NAPROXEN SODIUM 550 MG PO TABS: 550.0000 mg | ORAL_TABLET | Freq: Two times a day (BID) | ORAL | 1 refills | Status: DC

## 2021-07-10 NOTE — Progress Notes (Signed)
Acute Office Visit  Subjective:    Patient ID: Angela Martin, female    DOB: [birth year], 40 y.o.   MRN: [id number]  Chief Complaint  Patient presents with   female concerns    Patient states she has been bleeding (watery, bloody discharge) for about 3 weeks now. She states her heavy flow started last night. Patient states she has pain in her left side. She states she is very boated. She states they are a possibly of pregnancy.     HPI Patient is in today for complaints of abnormal menses and pelvic pain Pt states that her last regular period was a month ago - since that time she had continued to have a watery, brown discharge/old blood for about 3-4 weeks She has had some lower abdominal cramping as well However last night she states she started bleeding like her normal period (which would be on time) She is concerned because she does have a history of having 2 miscarriages in the past She is also overdue for a pap smear - last one being over 13 years ago She is concerned about having endometriosis as well because she states her periods have gotten more heavy in the past several months  Past Medical History:  Diagnosis Date   Hypertension during pregnancy    Migraines    Scoliosis     Past Surgical History:  Procedure Laterality Date   CESAREAN SECTION     CHOLECYSTECTOMY     plantar wart removal  Right    foot   TONSILLECTOMY      Family History  Problem Relation Age of Onset   Heart murmur Mother    Heart disease Mother    Cancer Father    GER disease Father    Colitis Father    Ovarian cancer Sister        at age 39   Other Sister        disc issues   Congestive Heart Failure Maternal Grandmother    Emphysema Paternal Grandmother     Social History   Socioeconomic History   Marital status: Married    Spouse name: Not on file   Number of children: Not on file   Years of education: Not on file   Highest education level: Not on file  Occupational  History   Not on file  Tobacco Use   Smoking status: Never   Smokeless tobacco: Never  Substance and Sexual Activity   Alcohol use: Never   Drug use: Yes    Types: Marijuana   Sexual activity: Yes  Other Topics Concern   Not on file  Social History Narrative   Not on file   Social Determinants of Health   Financial Resource Strain: Not on file  Food Insecurity: Not on file  Transportation Needs: Not on file  Physical Activity: Not on file  Stress: Not on file  Social Connections: Not on file  Intimate Partner Violence: Not on file    Outpatient Medications Prior to Visit  Medication Sig Dispense Refill   cyclobenzaprine (FLEXERIL) 5 MG tablet Take 1 tablet (5 mg total) by mouth 2 (two) times daily as needed for muscle spasms. 60 tablet 0   DULoxetine (CYMBALTA) 30 MG capsule Take 1 capsule (30 mg total) by mouth daily. 60 capsule 2   gabapentin (NEURONTIN) 100 MG capsule Take 1 capsule (100 mg total) by mouth 3 (three) times daily. 90 capsule 2   No facility-administered medications prior to visit.  Allergies  Allergen Reactions   Morphine Anaphylaxis    Other reaction(s): Facial Swelling, Flushing   Oxycodone Anaphylaxis, Other (See Comments), Itching and Nausea Only   Morphine And Related    Other     SULFA DRUGS    Review of Systems CONSTITUTIONAL: Negative for chills, fatigue, fever, unintentional weight gain and unintentional weight loss.  CARDIOVASCULAR: Negative for chest pain, dizziness, palpitations and pedal edema.  RESPIRATORY: Negative for recent cough and dyspnea.  GASTROINTESTINAL: see HPI GU - see HPI    Objective:    Physical Exam  BP 126/84   Pulse 90   Temp (!) 97.4 F (36.3 C)   Ht 5\' 6"  (1.676 m)   Wt 260 lb (117.9 kg)   SpO2 98%   BMI 41.97 kg/m  Wt Readings from Last 3 Encounters:  07/10/21 260 lb (117.9 kg)  05/28/21 261 lb 6.4 oz (118.6 kg)  04/28/21 271 lb (122.9 kg)  PHYSICAL EXAM:   VS: BP 126/84   Pulse 90   Temp  (!) 97.4 F (36.3 C)   Ht 5\' 6"  (1.676 m)   Wt 260 lb (117.9 kg)   SpO2 98%   BMI 41.97 kg/m   GEN: Well nourished, well developed, in no acute distress  Cardiac: RRR; no murmurs, rubs, or gallops, Respiratory:  normal respiratory rate and pattern with no distress - normal breath sounds with no rales, rhonchi, wheezes or rubs GI: normal bowel sounds, no masses or tenderness Psych: euthymic mood, appropriate affect and demeanor  Office Visit on 07/10/2021  Component Date Value Ref Range Status   Preg Test, Ur 07/10/2021 Negative  Negative Final    Health Maintenance Due  Topic Date Due   PAP SMEAR-Modifier  Never done    There are no preventive care reminders to display for this patient.   Lab Results  Component Value Date   TSH 0.938 07/16/2020   Lab Results  Component Value Date   WBC 7.1 07/16/2020   HGB 13.0 07/16/2020   HCT 40.2 07/16/2020   MCV 89 07/16/2020   PLT 322 07/16/2020   Lab Results  Component Value Date   NA 139 07/16/2020   K 4.0 07/16/2020   CO2 21 07/16/2020   GLUCOSE 77 07/16/2020   BUN 11 07/16/2020   CREATININE 0.69 07/16/2020   BILITOT 0.4 07/16/2020   ALKPHOS 69 07/16/2020   AST 14 07/16/2020   ALT 14 07/16/2020   PROT 7.0 07/16/2020   ALBUMIN 4.1 07/16/2020   CALCIUM 9.0 07/16/2020   No results found for: CHOL No results found for: HDL No results found for: LDLCALC No results found for: TRIG No results found for: CHOLHDL No results found for: 09/15/2020     Assessment & Plan:  1. Abnormal menses - POCT urine pregnancy - CBC with Differential/Platelet - Comprehensive metabolic panel - TSH - Beta hCG quant (ref lab)  2. Pelvic pain - naproxen sodium (ANAPROX DS) 550 MG tablet; Take 1 tablet (550 mg total) by mouth 2 (two) times daily with a meal.  Dispense: 30 tablet; Refill: 1    Meds ordered this encounter  Medications   naproxen sodium (ANAPROX DS) 550 MG tablet    Sig: Take 1 tablet (550 mg total) by mouth 2 (two)  times daily with a meal.    Dispense:  30 tablet    Refill:  1    Order Specific Question:   Supervising Provider    Answer11/05/2020 [phone]  Orders Placed This Encounter  Procedures   CBC with Differential/Platelet   Comprehensive metabolic panel   TSH   Beta hCG quant (ref lab)   POCT urine pregnancy      Follow-up: No follow-ups on file. - will get labwork and try medication - recommend that patient follow up with GYN for exam and possible ultrasound  An After Visit Summary was printed and given to the patient.  Jettie Pagan [facility] [phone]

## 2021-07-11 ENCOUNTER — Other Ambulatory Visit: Payer: Self-pay | Admitting: Physician Assistant

## 2021-07-11 DIAGNOSIS — N926 Irregular menstruation, unspecified: Secondary | ICD-10-CM

## 2021-07-11 DIAGNOSIS — R102 Pelvic and perineal pain: Secondary | ICD-10-CM

## 2021-07-11 LAB — CBC WITH DIFFERENTIAL/PLATELET
Basophils Absolute: 0.1 10*3/uL (ref 0.0–0.2)
Basos: 1 %
EOS (ABSOLUTE): 0.4 10*3/uL (ref 0.0–0.4)
Eos: 4 %
Hematocrit: 38.6 % (ref 34.0–46.6)
Hemoglobin: 12.3 g/dL (ref 11.1–15.9)
Immature Grans (Abs): 0 10*3/uL (ref 0.0–0.1)
Immature Granulocytes: 0 %
Lymphocytes Absolute: 2.2 10*3/uL (ref 0.7–3.1)
Lymphs: 23 %
MCH: 26.6 pg (ref 26.6–33.0)
MCHC: 31.9 g/dL (ref 31.5–35.7)
MCV: 83 fL (ref 79–97)
Monocytes Absolute: 0.8 10*3/uL (ref 0.1–0.9)
Monocytes: 9 %
Neutrophils Absolute: 5.9 10*3/uL (ref 1.4–7.0)
Neutrophils: 63 %
Platelets: 386 10*3/uL (ref 150–450)
RBC: 4.63 x10E6/uL (ref 3.77–5.28)
RDW: 14.3 % (ref 11.7–15.4)
WBC: 9.3 10*3/uL (ref 3.4–10.8)

## 2021-07-11 LAB — COMPREHENSIVE METABOLIC PANEL
ALT: 15 IU/L (ref 0–32)
AST: 16 IU/L (ref 0–40)
Albumin/Globulin Ratio: 1.8 (ref 1.2–2.2)
Albumin: 4.4 g/dL (ref 3.8–4.8)
Alkaline Phosphatase: 85 IU/L (ref 44–121)
BUN/Creatinine Ratio: 16 (ref 9–23)
BUN: 13 mg/dL (ref 6–20)
Bilirubin Total: <0.2 mg/dL (ref 0.0–1.2)
CO2: 23 mmol/L (ref 20–29)
Calcium: 9.2 mg/dL (ref 8.7–10.2)
Chloride: 104 mmol/L (ref 96–106)
Creatinine, Ser: 0.79 mg/dL (ref 0.57–1.00)
Globulin, Total: 2.4 g/dL (ref 1.5–4.5)
Glucose: 89 mg/dL (ref 65–99)
Potassium: 4.4 mmol/L (ref 3.5–5.2)
Sodium: 140 mmol/L (ref 134–144)
Total Protein: 6.8 g/dL (ref 6.0–8.5)
eGFR: 99 mL/min/{1.73_m2} (ref >59)

## 2021-07-11 LAB — BETA HCG QUANT (REF LAB): hCG Quant: <1 m[IU]/mL

## 2021-07-11 LAB — TSH: TSH: 0.699 u[IU]/mL (ref 0.450–4.500)

## 2021-08-11 LAB — RESULTS CONSOLE HPV: CHL HPV: NEGATIVE

## 2021-08-11 LAB — HM PAP SMEAR: HM Pap smear: NEGATIVE

## 2021-08-19 ENCOUNTER — Other Ambulatory Visit: Payer: Self-pay

## 2021-08-19 DIAGNOSIS — M545 Low back pain, unspecified: Secondary | ICD-10-CM

## 2021-08-19 MED ORDER — CYCLOBENZAPRINE HCL 5 MG PO TABS: 5.0000 mg | ORAL_TABLET | Freq: Two times a day (BID) | ORAL | 0 refills | Status: DC | PRN

## 2021-08-28 ENCOUNTER — Encounter: Payer: Self-pay | Admitting: Physician Assistant

## 2021-08-28 ENCOUNTER — Other Ambulatory Visit: Payer: Self-pay

## 2021-08-28 ENCOUNTER — Ambulatory Visit: Payer: Self-pay | Admitting: Physician Assistant

## 2021-08-28 VITALS — BP 122/82 | HR 93 | Temp 96.7°F | Ht 66.0 in | Wt 270.0 lb

## 2021-08-28 DIAGNOSIS — N926 Irregular menstruation, unspecified: Secondary | ICD-10-CM

## 2021-08-28 DIAGNOSIS — R002 Palpitations: Secondary | ICD-10-CM

## 2021-08-28 DIAGNOSIS — F322 Major depressive disorder, single episode, severe without psychotic features: Secondary | ICD-10-CM

## 2021-08-28 NOTE — Progress Notes (Signed)
Subjective:  Patient ID: Angela Martin, female    DOB: [birth year]  Age: 40 y.o. MRN: [id number]  Chief Complaint  Patient presents with   Depression    HPI  Pt here for follow up of depression - states she is doing well on cymbalta 30mg  qd  She also has history of fibromyalgia which she is taking this medication for as well  Pt has seen GYN and has had a pap smear and is going to return for a biopsy for her pelvic pain and abnormal menses -- states that provider adjusted her neurontin dose to 200mg  tid which has helped with her pelvic   Pt states for the past few weeks she has noted she has been having episodes of palpitations and at times heart beating fast - states can be happening with activity or at rest Denies chest pain or shortness of breath Recently had labwork done including cbc, cmp and tsh which were all normal Current Outpatient Medications on File Prior to Visit  Medication Sig Dispense Refill   cyclobenzaprine (FLEXERIL) 5 MG tablet Take 1 tablet (5 mg total) by mouth 2 (two) times daily as needed for muscle spasms. 60 tablet 0   DULoxetine (CYMBALTA) 30 MG capsule Take 1 capsule (30 mg total) by mouth daily. 60 capsule 2   gabapentin (NEURONTIN) 100 MG capsule Take 200 mg by mouth 3 (three) times daily.     No current facility-administered medications on file prior to visit.   Past Medical History:  Diagnosis Date   Hypertension during pregnancy    Migraines    Scoliosis    Past Surgical History:  Procedure Laterality Date   CESAREAN SECTION     CHOLECYSTECTOMY     plantar wart removal  Right    foot   TONSILLECTOMY      Family History  Problem Relation Age of Onset   Heart murmur Mother    Heart disease Mother    Cancer Father    GER disease Father    Colitis Father    Ovarian cancer Sister        at age 58   Other Sister        disc issues   Congestive Heart Failure Maternal Grandmother    Emphysema Paternal Grandmother    Social History    Socioeconomic History   Marital status: Married    Spouse name: Not on file   Number of children: Not on file   Years of education: Not on file   Highest education level: Not on file  Occupational History   Not on file  Tobacco Use   Smoking status: Never   Smokeless tobacco: Never  Substance and Sexual Activity   Alcohol use: Never   Drug use: Yes    Types: Marijuana   Sexual activity: Yes  Other Topics Concern   Not on file  Social History Narrative   Not on file   Social Determinants of Health   Financial Resource Strain: Not on file  Food Insecurity: Not on file  Transportation Needs: Not on file  Physical Activity: Not on file  Stress: Not on file  Social Connections: Not on file    Review of Systems CONSTITUTIONAL: Negative for chills, fatigue, fever, unintentional weight gain and unintentional weight loss.   CARDIOVASCULAR: see HPI RESPIRATORY: Negative for recent cough and dyspnea.  NEUROLOGICAL: Negative for dizziness and headaches.  PSYCHIATRIC: Negative for sleep disturbance and to question depression screen.  Negative for depression, negative for  anhedonia.       Objective:  BP 122/82 (BP Location: Left Arm, Patient Position: Sitting)   Pulse 93   Temp (!) 96.7 F (35.9 C) (Temporal)   Ht 5\' 6"  (1.676 m)   Wt 270 lb (122.5 kg)   SpO2 97%   BMI 43.58 kg/m   BP/Weight 08/28/2021 07/10/2021 05/28/2021  Systolic BP 122 126 124  Diastolic BP 82 84 72  Wt. (Lbs) 270 260 261.4  BMI 43.58 41.97 42.19    Physical Exam PHYSICAL EXAM:   VS: BP 122/82 (BP Location: Left Arm, Patient Position: Sitting)   Pulse 93   Temp (!) 96.7 F (35.9 C) (Temporal)   Ht 5\' 6"  (1.676 m)   Wt 270 lb (122.5 kg)   SpO2 97%   BMI 43.58 kg/m   GEN: Well nourished, well developed, in no acute distress  Cardiac: RRR; no murmurs, rubs, or gallops,no edema - Respiratory:  normal respiratory rate and pattern with no distress - normal breath sounds with no rales,  rhonchi, wheezes or rubs Psych: euthymic mood, appropriate affect and demeanor  EKG - no acute changes Diabetic Foot Exam - Simple   No data filed      Lab Results  Component Value Date   WBC 9.3 07/10/2021   HGB 12.3 07/10/2021   HCT 38.6 07/10/2021   PLT 386 07/10/2021   GLUCOSE 89 07/10/2021   ALT 15 07/10/2021   AST 16 07/10/2021   NA 140 07/10/2021   K 4.4 07/10/2021   CL 104 07/10/2021   CREATININE 0.79 07/10/2021   BUN 13 07/10/2021   CO2 23 07/10/2021   TSH 0.699 07/10/2021      Assessment & Plan:   Problem List Items Addressed This Visit       Other   Depression, major, single episode, severe (HCC) Continue current meds as directed   Other Visit Diagnoses     Palpitations    -  Primary   Relevant Orders   EKG 12-Lead Recommend decrease caffeine intake Recent labwork normal --- pt defers cardiology consult at this time   Abnormal menses     Continue follow up with gyn as directed     .  No orders of the defined types were placed in this encounter.   Orders Placed This Encounter  Procedures   EKG 12-Lead     Follow-up: Return in about 3 months (around 11/28/2021) for chronic fasting follow up.  An After Visit Summary was printed and given to the patient.  09/09/2021 [facility] [phone]

## 2021-10-06 ENCOUNTER — Other Ambulatory Visit: Payer: Self-pay | Admitting: Physician Assistant

## 2021-10-06 DIAGNOSIS — F322 Major depressive disorder, single episode, severe without psychotic features: Secondary | ICD-10-CM

## 2021-10-06 DIAGNOSIS — M797 Fibromyalgia: Secondary | ICD-10-CM

## 2021-11-26 ENCOUNTER — Other Ambulatory Visit: Payer: Self-pay

## 2021-11-26 DIAGNOSIS — M545 Low back pain, unspecified: Secondary | ICD-10-CM

## 2021-11-26 MED ORDER — CYCLOBENZAPRINE HCL 5 MG PO TABS: 5.0000 mg | ORAL_TABLET | Freq: Two times a day (BID) | ORAL | 0 refills | Status: DC | PRN

## 2022-02-23 ENCOUNTER — Ambulatory Visit (INDEPENDENT_AMBULATORY_CARE_PROVIDER_SITE_OTHER): Payer: Self-pay | Admitting: Physician Assistant

## 2022-02-23 ENCOUNTER — Encounter: Payer: Self-pay | Admitting: Physician Assistant

## 2022-02-23 VITALS — BP 136/102 | HR 84 | Temp 96.1°F | Resp 18 | Ht 66.5 in | Wt 283.0 lb

## 2022-02-23 DIAGNOSIS — I10 Essential (primary) hypertension: Secondary | ICD-10-CM

## 2022-02-23 DIAGNOSIS — F418 Other specified anxiety disorders: Secondary | ICD-10-CM

## 2022-02-23 MED ORDER — ESCITALOPRAM OXALATE 10 MG PO TABS
10.0000 mg | ORAL_TABLET | Freq: Every day | ORAL | 1 refills | Status: DC
Start: 2022-02-23 — End: 2022-08-06

## 2022-02-23 NOTE — Progress Notes (Signed)
? ?Subjective:  ?Patient ID: Angela Martin, female    DOB: 07-26-1981  Age: 41 y.o. MRN: [id number] ? ?Chief Complaint  ?Patient presents with  ? Hospitalization Follow-up  ? Hypertension  ? ? ?HPI ? Pt in for follow up from ED - she was seen at [facility] on Friday for chest pain and elevated blood pressure.  Labwork, EKG and chest xray were normal.  Here today for follow up . States she is overall feeling better .  She was placed on norvasc 5mg  qd.  Has not taken dose of medication today yet ? ?Pt stopped duloxetine on her own about 3 months ago - she is starting to have anger issues and breakthrough anxiety symptoms ?Current Outpatient Medications on File Prior to Visit  ?Medication Sig Dispense Refill  ? amLODipine (NORVASC) 5 MG tablet Take 5 mg by mouth daily.    ? aspirin EC 81 MG tablet Take 162 mg by mouth every 4 (four) hours as needed. Swallow whole.    ? cyclobenzaprine (FLEXERIL) 5 MG tablet Take 1 tablet (5 mg total) by mouth 2 (two) times daily as needed for muscle spasms. 60 tablet 0  ? gabapentin (NEURONTIN) 100 MG capsule Take 200 mg by mouth 3 (three) times daily.    ? ?No current facility-administered medications on file prior to visit.  ? ?Past Medical History:  ?Diagnosis Date  ? Hypertension during pregnancy   ? Migraines   ? Scoliosis   ? ?Past Surgical History:  ?Procedure Laterality Date  ? CESAREAN SECTION    ? CHOLECYSTECTOMY    ? plantar wart removal  Right   ? foot  ? TONSILLECTOMY    ?  ?Family History  ?Problem Relation Age of Onset  ? Heart murmur Mother   ? Heart disease Mother   ? Cancer Father   ? GER disease Father   ? Colitis Father   ? Ovarian cancer Sister   ?     at age 107  ? Other Sister   ?     disc issues  ? Congestive Heart Failure Maternal Grandmother   ? Emphysema Paternal Grandmother   ? ?Social History  ? ?Socioeconomic History  ? Marital status: Married  ?  Spouse name: Not on file  ? Number of children: Not on file  ? Years of education: Not on file  ?  Highest education level: Not on file  ?Occupational History  ? Not on file  ?Tobacco Use  ? Smoking status: Never  ? Smokeless tobacco: Never  ?Substance and Sexual Activity  ? Alcohol use: Never  ? Drug use: Yes  ?  Types: Marijuana  ? Sexual activity: Yes  ?Other Topics Concern  ? Not on file  ?Social History Narrative  ? Not on file  ? ?Social Determinants of Health  ? ?Financial Resource Strain: Not on file  ?Food Insecurity: Not on file  ?Transportation Needs: Not on file  ?Physical Activity: Not on file  ?Stress: Not on file  ?Social Connections: Not on file  ? ? ?Review of Systems ?CONSTITUTIONAL: Negative for chills, fatigue, fever, unintentional weight gain and unintentional weight loss.  ?CARDIOVASCULAR: see HPI ?RESPIRATORY: Negative for recent cough and dyspnea.  ?GASTROINTESTINAL: Negative for abdominal pain, acid reflux symptoms, constipation, diarrhea, nausea and vomiting.  ?NEUROLOGICAL: Negative for dizziness and headaches.  ?PSYCHIATRIC: see HPI ?   ? ?Objective:  ?BP (!) 136/102   Pulse 84   Temp (!) 96.1 ?F (35.6 ?C)   Resp  18   Ht 5' 6.5" (1.689 m)   Wt 283 lb (128.4 kg)   BMI 44.99 kg/m?  ? ? ?  02/23/2022  ?  2:43 PM 08/28/2021  ? 11:09 AM 07/10/2021  ? 11:03 AM  ?BP/Weight  ?Systolic BP [id number] 123XX123 123XX123  ?Diastolic BP [id number] 82 84  ?Wt. (Lbs) 283 270 260  ?BMI 44.99 kg/m2 43.58 kg/m2 41.97 kg/m2  ? ? ?Physical Exam ?PHYSICAL EXAM:  ? ?VS: BP (!) 136/102   Pulse 84   Temp (!) 96.1 ?F (35.6 ?C)   Resp 18   Ht 5' 6.5" (1.689 m)   Wt 283 lb (128.4 kg)   BMI 44.99 kg/m?  ? ?GEN: Well nourished, well developed, in no acute distress  ?Cardiac: RRR; no murmurs, ?Respiratory:  normal respiratory rate and pattern with no distress - normal breath sounds with no rales, rhonchi, wheezes or rubs ? ?Psych: euthymic mood, appropriate affect and demeanor ? ?Diabetic Foot Exam - Simple   ?No data filed ?  ?  ? ?Lab Results  ?Component Value Date  ? WBC 9.3 07/10/2021  ? HGB 12.3 07/10/2021  ? HCT 38.6  07/10/2021  ? PLT 386 07/10/2021  ? GLUCOSE 89 07/10/2021  ? ALT 15 07/10/2021  ? AST 16 07/10/2021  ? NA 140 07/10/2021  ? K 4.4 07/10/2021  ? CL 104 07/10/2021  ? CREATININE 0.79 07/10/2021  ? BUN 13 07/10/2021  ? CO2 23 07/10/2021  ? TSH 0.699 07/10/2021  ? ? ? ? ?Assessment & Plan:  ? ?Problem List Items Addressed This Visit   ?None ?Visit Diagnoses   ? ? Benign hypertension    -  Primary  ? Relevant Medications  ? amLODipine (NORVASC) 5 MG tablet - continue qd  ? aspirin EC 81 MG tablet  ? Depression with anxiety     ?Rx for lexapro 10mg  qd  ?Rx  ?  ?. ? ?Meds ordered this encounter  ?Medications  ? escitalopram (LEXAPRO) 10 MG tablet  ?  Sig: Take 1 tablet (10 mg total) by mouth daily.  ?  Dispense:  30 tablet  ?  Refill:  1  ?  Order Specific Question:   Supervising Provider  ?  AnswerRochel Brome [id number]  ? ? ?No orders of the defined types were placed in this encounter. ?  ? ?Follow-up: Return in about 3 weeks (around 03/16/2022) for bp check nurse visit. ? ?An After Visit Summary was printed and given to the patient. ? ?SARA R Delorean Knutzen, PA-C ?[place] ?[phone] ?

## 2022-03-02 ENCOUNTER — Telehealth: Payer: Self-pay

## 2022-03-02 NOTE — Telephone Encounter (Signed)
Patient informed and expressed understanding, appt made. ?

## 2022-03-02 NOTE — Telephone Encounter (Signed)
Patient called stating her at home b/p readings have been:  ?(4/17 p.m.)134/100 p150 (was cleaning house)  ?(4/18 a.m.) 140/101 p95, (4/18 p.m.) 139/105 p116,  ?(4/19 a.m.) 134/100 p97, (4/19 p.m.)146/108 p121,  ?(4/20 a.m.) 146/101 p97, (4/20 p.m.) 158/86 p119,  ?(4/21 a.m.)132/96 p104, (4/20 p.m.) 139/105 p101 ?

## 2022-03-02 NOTE — Telephone Encounter (Signed)
Recommend to increase norvasc to 10mg  qd and follow up with me in next 2 weeks ?

## 2022-03-09 ENCOUNTER — Other Ambulatory Visit: Payer: Self-pay

## 2022-03-09 MED ORDER — AMLODIPINE BESYLATE 10 MG PO TABS: 10.0000 mg | ORAL_TABLET | Freq: Every day | ORAL | 1 refills | Status: DC

## 2022-03-12 ENCOUNTER — Telehealth: Payer: Self-pay

## 2022-03-12 NOTE — Telephone Encounter (Signed)
Patient aware. Appointment with Angela Martin cancelled, patient will come that day for nurse visit.  ? ?Angela Martin 03/12/22 10:41 AM ? ?

## 2022-03-12 NOTE — Telephone Encounter (Signed)
Patient has two appointment on 03/16/2022. One is with you and another one nurse visit. She mentioned her blood pressure has been good 120/80. She is not working right now and she is tied with money. She asked if she can skip this appointments and do a regular visit? Please advice. ?

## 2022-03-12 NOTE — Telephone Encounter (Signed)
You can cancel the appt with me but she needs to come by around that date for a bp check with nurse  ?

## 2022-03-16 ENCOUNTER — Ambulatory Visit: Payer: Self-pay

## 2022-03-16 ENCOUNTER — Ambulatory Visit: Payer: Self-pay | Admitting: Physician Assistant

## 2022-03-16 VITALS — BP 132/72

## 2022-03-16 DIAGNOSIS — I10 Essential (primary) hypertension: Secondary | ICD-10-CM

## 2022-03-16 NOTE — Progress Notes (Signed)
Patient in office for BP check. BP 132/72. She has noticed some bilateral foot edema since beginning amlodipine.  ?Provider recommended elevation and decrease in sodium intake. Patient VU. She has been wearing compression socks, she will continue this as needed as well.  ? ?Angela Martin 03/16/22 11:48 AM ? ?

## 2022-04-27 ENCOUNTER — Other Ambulatory Visit: Payer: Self-pay | Admitting: Physician Assistant

## 2022-04-27 DIAGNOSIS — M545 Low back pain, unspecified: Secondary | ICD-10-CM

## 2022-07-31 DIAGNOSIS — R202 Paresthesia of skin: Secondary | ICD-10-CM | POA: Diagnosis not present

## 2022-07-31 DIAGNOSIS — R42 Dizziness and giddiness: Secondary | ICD-10-CM | POA: Diagnosis not present

## 2022-07-31 DIAGNOSIS — S0101XA Laceration without foreign body of scalp, initial encounter: Secondary | ICD-10-CM | POA: Diagnosis not present

## 2022-07-31 DIAGNOSIS — R55 Syncope and collapse: Secondary | ICD-10-CM | POA: Diagnosis not present

## 2022-07-31 DIAGNOSIS — S0191XA Laceration without foreign body of unspecified part of head, initial encounter: Secondary | ICD-10-CM | POA: Diagnosis not present

## 2022-08-03 ENCOUNTER — Telehealth: Payer: Self-pay

## 2022-08-03 NOTE — Telephone Encounter (Signed)
Patient called and needed a hospital follow was seen [facility] on Friday passing out and she hit her head, she has 8 staples in her head and was told to follow up with her PCP as soon as possible because it was undetermined why she passed out. Please advise your next open time available is next Monday. Patient would like to follow up this week

## 2022-08-03 NOTE — Telephone Encounter (Signed)
Patient made aware, appointment has been schedule.

## 2022-08-05 ENCOUNTER — Encounter: Payer: Self-pay | Admitting: Physician Assistant

## 2022-08-05 ENCOUNTER — Ambulatory Visit (INDEPENDENT_AMBULATORY_CARE_PROVIDER_SITE_OTHER): Payer: Self-pay | Admitting: Physician Assistant

## 2022-08-05 VITALS — BP 136/100 | HR 112 | Temp 97.6°F

## 2022-08-05 DIAGNOSIS — R072 Precordial pain: Secondary | ICD-10-CM

## 2022-08-05 DIAGNOSIS — I10 Essential (primary) hypertension: Secondary | ICD-10-CM

## 2022-08-05 DIAGNOSIS — R55 Syncope and collapse: Secondary | ICD-10-CM | POA: Diagnosis not present

## 2022-08-06 ENCOUNTER — Encounter: Payer: Self-pay | Admitting: Physician Assistant

## 2022-08-06 DIAGNOSIS — R55 Syncope and collapse: Secondary | ICD-10-CM

## 2022-08-06 DIAGNOSIS — I10 Essential (primary) hypertension: Secondary | ICD-10-CM | POA: Insufficient documentation

## 2022-08-06 DIAGNOSIS — R072 Precordial pain: Secondary | ICD-10-CM

## 2022-08-06 HISTORY — DX: Syncope and collapse: R55

## 2022-08-06 HISTORY — DX: Essential (primary) hypertension: I10

## 2022-08-06 HISTORY — DX: Precordial pain: R07.2

## 2022-08-06 NOTE — Progress Notes (Signed)
Acute Office Visit  Subjective:    Patient ID: Angela Martin, female    DOB: 26-Apr-1981, 42 y.o.   MRN: [id number]  Chief Complaint  Patient presents with   Chest Pain    HPI: Patient is in today initially for ED follow up  - she was seen last week for episode of dizziness with syncopal episode.  She states she was folding clothes standing by her bed and felt dizzy then called for her husband.  She thinks she passed out for maybe 2-3 seconds and within that time had hit her head on a table causing a laceration -- she was transported to ED and evaluated there - she states she received staples to her laceration and had a head CT which was normal According to patient she did not receive any other workup and no labwork done She gives conflicting history of chest pain stating it may have started before the episode or possibly after her ER visit.  Today she states that she has felt pinching and stabbing type pains in her chest along with heart fluttering sensation and while in waiting room to be seen felt dizzy again Pt was evaluated urgently in office - bp elevated and pulse elevated --- EKG normal except sinus tach and she had some mild response to one dose of nitro given (ASA 345m also given)  Past Medical History:  Diagnosis Date   Hypertension during pregnancy    Migraines    Scoliosis     Past Surgical History:  Procedure Laterality Date   CESAREAN SECTION     CHOLECYSTECTOMY     plantar wart removal  Right    foot   TONSILLECTOMY      Family History  Problem Relation Age of Onset   Heart murmur Mother    Heart disease Mother    Cancer Father    GER disease Father    Colitis Father    Ovarian cancer Sister        at age 453  Other Sister        disc issues   Congestive Heart Failure Maternal Grandmother    Emphysema Paternal Grandmother     Social History   Socioeconomic History   Marital status: Married    Spouse name: Not on file   Number of children: Not  on file   Years of education: Not on file   Highest education level: Not on file  Occupational History   Not on file  Tobacco Use   Smoking status: Never   Smokeless tobacco: Never  Substance and Sexual Activity   Alcohol use: Never   Drug use: Yes    Types: Marijuana   Sexual activity: Yes  Other Topics Concern   Not on file  Social History Narrative   Not on file   Social Determinants of Health   Financial Resource Strain: Not on file  Food Insecurity: Not on file  Transportation Needs: Not on file  Physical Activity: Not on file  Stress: Not on file  Social Connections: Not on file  Intimate Partner Violence: Not on file    Outpatient Medications Prior to Visit  Medication Sig Dispense Refill   amLODipine (NORVASC) 10 MG tablet Take 1 tablet (10 mg total) by mouth daily. 90 tablet 1   aspirin EC 81 MG tablet Take 162 mg by mouth every 4 (four) hours as needed. Swallow whole.     cyclobenzaprine (FLEXERIL) 5 MG tablet TAKE 1 TABLET BY MOUTH 2  TIMES DAILY AS NEEDED FOR MUSCLE SPASMS. 60 tablet 0   escitalopram (LEXAPRO) 10 MG tablet Take 1 tablet (10 mg total) by mouth daily. 30 tablet 1   gabapentin (NEURONTIN) 100 MG capsule Take 200 mg by mouth 3 (three) times daily.     No facility-administered medications prior to visit.    Allergies  Allergen Reactions   Amoxicillin Anaphylaxis   Morphine Anaphylaxis    Other reaction(s): Facial Swelling, Flushing   Oxycodone Anaphylaxis, Other (See Comments), Itching and Nausea Only   Penicillin G Anaphylaxis   Morphine And Related    Other     SULFA DRUGS    Review of Systems CONSTITUTIONAL: see HPI E/N/T: Negative for ear pain, nasal congestion and sore throat.  CARDIOVASCULAR: see HPI RESPIRATORY: Negative for recent cough and dyspnea.  GASTROINTESTINAL: Negative for abdominal pain, acid reflux symptoms, constipation, diarrhea, nausea and vomiting.   NEUROLOGICAL: see HPI PSYCHIATRIC: Negative for sleep  disturbance and to question depression screen.  Negative for depression, negative for anhedonia. However she states she has stopped all of her depression/anxiety medications several months ago        Objective:  PHYSICAL EXAM:   VS: BP (!) 136/100 (BP Location: Right Arm, Patient Position: Sitting, Cuff Size: Large)   Pulse (!) 112   Temp 97.6 F (36.4 C) (Temporal)   SpO2 99%  Orthostatic Vitals for the past 48 hrs (Last 6 readings):  Patient Position Orthostatic BP Orthostatic Pulse  08/05/22 1535 Sitting -- --  08/05/22 1536 Supine 128/80 93  08/05/22 1537 Sitting 120/80 114  08/05/22 1538 Standing 116/80 124    GEN: Well nourished, well developed, - pt fanning herself and states feeling dizzy Cardiac: slightly tachycardic - regular rhythm no murmurs, rubs, or gallops,no edema -  Respiratory:  normal respiratory rate and pattern with no distress - normal breath sounds with no rales, rhonchi, wheezes or rubs GI: normal bowel sounds, no masses or tenderness Psych: euthymic mood, appropriate affect and demeanor  EKG - sinus tach - no other acute changes Health Maintenance Due  Topic Date Due   PAP SMEAR-Modifier  Never done    There are no preventive care reminders to display for this patient.   Lab Results  Component Value Date   TSH 0.699 07/10/2021   Lab Results  Component Value Date   WBC 9.3 07/10/2021   HGB 12.3 07/10/2021   HCT 38.6 07/10/2021   MCV 83 07/10/2021   PLT 386 07/10/2021   Lab Results  Component Value Date   NA 140 07/10/2021   K 4.4 07/10/2021   CO2 23 07/10/2021   GLUCOSE 89 07/10/2021   BUN 13 07/10/2021   CREATININE 0.79 07/10/2021   BILITOT <0.2 07/10/2021   ALKPHOS 85 07/10/2021   AST 16 07/10/2021   ALT 15 07/10/2021   PROT 6.8 07/10/2021   ALBUMIN 4.4 07/10/2021   CALCIUM 9.2 07/10/2021   EGFR 99 07/10/2021   No results found for: "CHOL" No results found for: "HDL" No results found for: "LDLCALC" No results found for:  "TRIG" No results found for: "CHOLHDL" No results found for: "HGBA1C"     Assessment & Plan:   Problem List Items Addressed This Visit       Cardiovascular and Mediastinum   Benign hypertension     Other   Precordial pain - Primary   Relevant Orders   EKG 12-Lead (Completed)   Syncope   PATIENT TO BE TRANSPORTED BY EMS TO ED FOR FURTHER  EVALUATION AND TREATMENT   Orders Placed This Encounter  Procedures   EKG 12-Lead     Follow-up: Return for will refer to cardiology after evaluation at ED.  An After Visit Summary was printed and given to the patient.  Yetta Flock [facility] [phone]

## 2022-08-07 ENCOUNTER — Telehealth: Payer: Self-pay

## 2022-08-07 NOTE — Telephone Encounter (Signed)
Patient called and leave message asking if she should keep taking beta blocker that she got from ED.  Per provider patient is to keep taking all current medication and the once ED doctor put her on and follow up with cardiologist.

## 2022-08-08 ENCOUNTER — Emergency Department (HOSPITAL_COMMUNITY): Payer: Medicaid Other

## 2022-08-08 ENCOUNTER — Other Ambulatory Visit: Payer: Self-pay

## 2022-08-08 ENCOUNTER — Emergency Department (HOSPITAL_COMMUNITY)
Admission: EM | Admit: 2022-08-08 | Discharge: 2022-08-09 | Disposition: A | Discharge status: Home / Self Care | Payer: Medicaid Other | Attending: Emergency Medicine | Admitting: Emergency Medicine

## 2022-08-08 DIAGNOSIS — Z7982 Long term (current) use of aspirin: Secondary | ICD-10-CM | POA: Insufficient documentation

## 2022-08-08 DIAGNOSIS — R079 Chest pain, unspecified: Secondary | ICD-10-CM | POA: Diagnosis not present

## 2022-08-08 DIAGNOSIS — N9489 Other specified conditions associated with female genital organs and menstrual cycle: Secondary | ICD-10-CM | POA: Insufficient documentation

## 2022-08-08 DIAGNOSIS — R0602 Shortness of breath: Secondary | ICD-10-CM | POA: Diagnosis present

## 2022-08-08 DIAGNOSIS — E860 Dehydration: Secondary | ICD-10-CM | POA: Insufficient documentation

## 2022-08-08 DIAGNOSIS — Z79899 Other long term (current) drug therapy: Secondary | ICD-10-CM | POA: Diagnosis not present

## 2022-08-08 DIAGNOSIS — E872 Acidosis, unspecified: Secondary | ICD-10-CM | POA: Diagnosis not present

## 2022-08-08 DIAGNOSIS — R519 Headache, unspecified: Secondary | ICD-10-CM | POA: Insufficient documentation

## 2022-08-08 DIAGNOSIS — R55 Syncope and collapse: Secondary | ICD-10-CM | POA: Diagnosis not present

## 2022-08-08 LAB — BASIC METABOLIC PANEL
Anion gap: 16 — ABNORMAL HIGH (ref 5–15)
BUN: 15 mg/dL (ref 6–20)
CO2: 16 mmol/L — ABNORMAL LOW (ref 22–32)
Calcium: 9.7 mg/dL (ref 8.9–10.3)
Chloride: 106 mmol/L (ref 98–111)
Creatinine, Ser: 0.77 mg/dL (ref 0.44–1.00)
GFR, Estimated: >60 mL/min (ref >60)
Glucose, Bld: 95 mg/dL (ref 70–99)
Potassium: 3.2 mmol/L — ABNORMAL LOW (ref 3.5–5.1)
Sodium: 138 mmol/L (ref 135–145)

## 2022-08-08 LAB — CBC
HCT: 38.7 % (ref 36.0–46.0)
Hemoglobin: 13 g/dL (ref 12.0–15.0)
MCH: 26.3 pg (ref 26.0–34.0)
MCHC: 33.6 g/dL (ref 30.0–36.0)
MCV: 78.3 fL — ABNORMAL LOW (ref 80.0–100.0)
Platelets: 418 10*3/uL — ABNORMAL HIGH (ref 150–400)
RBC: 4.94 MIL/uL (ref 3.87–5.11)
RDW: 13.6 % (ref 11.5–15.5)
WBC: 12.2 10*3/uL — ABNORMAL HIGH (ref 4.0–10.5)
nRBC: 0 % (ref 0.0–0.2)

## 2022-08-08 LAB — TROPONIN I (HIGH SENSITIVITY)
Troponin I (High Sensitivity): 3 ng/L (ref <18)
Troponin I (High Sensitivity): 3 ng/L (ref <18)

## 2022-08-08 NOTE — ED Provider Triage Note (Signed)
Emergency Medicine Provider Triage Evaluation Note  Angela Martin , a 41 y.o. female  was evaluated in triage.  Pt complains of left-sided chest heaviness rated 8 out of 10 in severity, shortness of breath, and left arm pain/tingling.  The chest heaviness and left arm symptoms began today.  She has been feeling intermittently short of breath for the past 2 weeks.  She had a syncopal episode within the past 2 weeks with a fall and currently has staples in her scalp from that episode.  She began taking metoprolol and amlodipine recently blood pressure control.  Review of Systems  Positive: As above Negative: As above  Physical Exam  BP 120/88   Pulse (!) 108   Temp (!) 97.5 F (36.4 C) (Oral)   Resp 16   SpO2 100%  Gen:   Awake, no distress   Resp:  Normal effort  MSK:   Moves extremities without difficulty  Other:    Medical Decision Making  Medically screening exam initiated at 8:13 PM.  Appropriate orders placed.  Kensi Karr was informed that the remainder of the evaluation will be completed by another provider, this initial triage assessment does not replace that evaluation, and the importance of remaining in the ED until their evaluation is complete.     Dorothyann Peng, PA-C 08/08/22 2014

## 2022-08-08 NOTE — ED Triage Notes (Signed)
Pt here from home for cp/sob that suddenly got worse today. Per husband pt has not been feeling well for weeks and had a syncopal episode recently where she fell because she "couldn't breathe". Pt states she feels like she can't catch her breath.

## 2022-08-09 ENCOUNTER — Emergency Department (HOSPITAL_COMMUNITY): Payer: Medicaid Other

## 2022-08-09 LAB — URINALYSIS, ROUTINE W REFLEX MICROSCOPIC
Bilirubin Urine: NEGATIVE
Glucose, UA: NEGATIVE mg/dL
Hgb urine dipstick: NEGATIVE
Ketones, ur: 80 mg/dL — AB
Nitrite: NEGATIVE
Protein, ur: 30 mg/dL — AB
Specific Gravity, Urine: 1.026 (ref 1.005–1.030)
WBC, UA: >50 WBC/hpf — ABNORMAL HIGH (ref 0–5)
pH: 5 (ref 5.0–8.0)

## 2022-08-09 LAB — I-STAT VENOUS BLOOD GAS, ED
Acid-base deficit: 4 mmol/L — ABNORMAL HIGH (ref 0.0–2.0)
Bicarbonate: 17.3 mmol/L — ABNORMAL LOW (ref 20.0–28.0)
Calcium, Ion: 1.12 mmol/L — ABNORMAL LOW (ref 1.15–1.40)
HCT: 41 % (ref 36.0–46.0)
Hemoglobin: 13.9 g/dL (ref 12.0–15.0)
O2 Saturation: 85 %
Potassium: 4.5 mmol/L (ref 3.5–5.1)
Sodium: 137 mmol/L (ref 135–145)
TCO2: 18 mmol/L — ABNORMAL LOW (ref 22–32)
pCO2, Ven: 21.7 mmHg — ABNORMAL LOW (ref 44–60)
pH, Ven: 7.511 — ABNORMAL HIGH (ref 7.25–7.43)
pO2, Ven: 43 mmHg (ref 32–45)

## 2022-08-09 LAB — I-STAT BETA HCG BLOOD, ED (MC, WL, AP ONLY): I-stat hCG, quantitative: <5 m[IU]/mL (ref <5)

## 2022-08-09 LAB — T4, FREE: Free T4: 1.09 ng/dL (ref 0.61–1.12)

## 2022-08-09 LAB — D-DIMER, QUANTITATIVE: D-Dimer, Quant: 0.43 ug/mL-FEU (ref 0.00–0.50)

## 2022-08-09 LAB — ETHANOL: Alcohol, Ethyl (B): <10 mg/dL (ref <10)

## 2022-08-09 LAB — SALICYLATE LEVEL: Salicylate Lvl: <7 mg/dL — ABNORMAL LOW (ref 7.0–30.0)

## 2022-08-09 LAB — LACTIC ACID, PLASMA: Lactic Acid, Venous: 1.8 mmol/L (ref 0.5–1.9)

## 2022-08-09 LAB — TSH: TSH: 1.164 u[IU]/mL (ref 0.350–4.500)

## 2022-08-09 MED ORDER — PROCHLORPERAZINE EDISYLATE 10 MG/2ML IJ SOLN
10.0000 mg | Freq: Once | INTRAMUSCULAR | Status: AC
  Administered 2022-08-09: 10 mg via INTRAVENOUS
  Filled 2022-08-09: qty 2

## 2022-08-09 MED ORDER — SODIUM CHLORIDE 0.9 % IV BOLUS
1000.0000 mL | Freq: Once | INTRAVENOUS | Status: AC
  Administered 2022-08-09: 1000 mL via INTRAVENOUS

## 2022-08-09 MED ORDER — LORAZEPAM 2 MG/ML IJ SOLN
0.5000 mg | Freq: Once | INTRAMUSCULAR | Status: AC
  Administered 2022-08-09: 0.5 mg via INTRAVENOUS
  Filled 2022-08-09: qty 1

## 2022-08-09 MED ORDER — DIPHENHYDRAMINE HCL 50 MG/ML IJ SOLN
25.0000 mg | Freq: Once | INTRAMUSCULAR | Status: DC
  Filled 2022-08-09: qty 1

## 2022-08-09 NOTE — ED Notes (Signed)
Pt ambulatory to bathroom

## 2022-08-09 NOTE — Discharge Instructions (Signed)
The lab work today would suggest that you are dehydrated.  Please try and increase your fluid intake at home.  Please discuss this with your family doctor and try to see them within a week and you can have your lab work rechecked.

## 2022-08-09 NOTE — ED Provider Notes (Signed)
[facility] EMERGENCY DEPARTMENT Provider Note   CSN: [id number] Arrival date & time: 08/08/22  1903     History  Chief Complaint  Patient presents with   Shortness of Breath   Chest Pain    Angela Martin is a 41 y.o. female.  41 yo F with a cc of sob.  Going on for about a year.  Patient worsening since she feel a couple days ago.  Since then she felt her breathing has gotten worse.  She has been feeling spots and tingling all over her body.  She has been seen for something similar about a year ago and she is not sure exactly what they told her was wrong.  She would like she is been eating and drinking normally except for her prolonged wait in the ER.  She denies any nausea vomiting or diarrhea.  She has been taking a baby aspirin for about 48 hours but only 1 a day.  She denies any other medications denies over-the-counter supplements denies alcohol use.   Shortness of Breath Associated symptoms: chest pain   Chest Pain Associated symptoms: shortness of breath        Home Medications Prior to Admission medications   Medication Sig Start Date End Date Taking? Authorizing Provider  amLODipine (NORVASC) 10 MG tablet Take 1 tablet (10 mg total) by mouth daily. 03/09/22   Marianne Sofia, PA-C  aspirin EC 81 MG tablet Take 162 mg by mouth every 4 (four) hours as needed. Swallow whole.    [provider]  cyclobenzaprine (FLEXERIL) 5 MG tablet TAKE 1 TABLET BY MOUTH 2 TIMES DAILY AS NEEDED FOR MUSCLE SPASMS. 04/27/22   Marianne Sofia, PA-C      Allergies    Amoxicillin, Morphine, Oxycodone, Penicillin g, Morphine and related, and Other    Review of Systems   Review of Systems  Respiratory:  Positive for shortness of breath.   Cardiovascular:  Positive for chest pain.    Physical Exam Updated Vital Signs BP 116/77   Pulse 99   Temp 98.1 F (36.7 C) (Oral)   Resp 18   LMP 07/15/2022 (Within Weeks)   SpO2 100%  Physical Exam Vitals and nursing note  reviewed.  Constitutional:      General: She is not in acute distress.    Appearance: She is well-developed. She is not diaphoretic.  HENT:     Head: Normocephalic and atraumatic.  Eyes:     Pupils: Pupils are equal, round, and reactive to light.  Cardiovascular:     Rate and Rhythm: Normal rate and regular rhythm.     Heart sounds: No murmur heard.    No friction rub. No gallop.  Pulmonary:     Effort: Pulmonary effort is normal.     Breath sounds: No wheezing or rales.  Abdominal:     General: There is no distension.     Palpations: Abdomen is soft.     Tenderness: There is no abdominal tenderness.  Musculoskeletal:        General: No tenderness.     Cervical back: Normal range of motion and neck supple.  Skin:    General: Skin is warm and dry.  Neurological:     Mental Status: She is alert and oriented to person, place, and time.     Cranial Nerves: Cranial nerves 2-12 are intact.     Sensory: Sensation is intact.     Motor: Motor function is intact.     Coordination:  Coordination is intact.  Psychiatric:        Behavior: Behavior normal.     ED Results / Procedures / Treatments   Labs (all labs ordered are listed, but only abnormal results are displayed) Labs Reviewed  BASIC METABOLIC PANEL - Abnormal; Notable for the following components:      Result Value   Potassium 3.2 (*)    CO2 16 (*)    Anion gap 16 (*)    All other components within normal limits  CBC - Abnormal; Notable for the following components:   WBC 12.2 (*)    MCV 78.3 (*)    Platelets 418 (*)    All other components within normal limits  SALICYLATE LEVEL - Abnormal; Notable for the following components:   Salicylate Lvl <9.7 (*)    All other components within normal limits  URINALYSIS, ROUTINE W REFLEX MICROSCOPIC - Abnormal; Notable for the following components:   Color, Urine AMBER (*)    APPearance CLOUDY (*)    Ketones, ur 80 (*)    Protein, ur 30 (*)    Leukocytes,Ua LARGE (*)     WBC, UA >50 (*)    Bacteria, UA MANY (*)    All other components within normal limits  I-STAT VENOUS BLOOD GAS, ED - Abnormal; Notable for the following components:   pH, Ven 7.511 (*)    pCO2, Ven 21.7 (*)    Bicarbonate 17.3 (*)    TCO2 18 (*)    Acid-base deficit 4.0 (*)    Calcium, Ion 1.12 (*)    All other components within normal limits  D-DIMER, QUANTITATIVE  LACTIC ACID, PLASMA  ETHANOL  TSH  T4, FREE  I-STAT BETA HCG BLOOD, ED ([facility], [facility], [facility] ONLY)  TROPONIN I (HIGH SENSITIVITY)  TROPONIN I (HIGH SENSITIVITY)    EKG EKG Interpretation  Date/Time:  Sunday August 09 2022 07:17:54 EDT Ventricular Rate:  107 PR Interval:  132 QRS Duration: 82 QT Interval:  332 QTC Calculation: 443 R Axis:   44 Text Interpretation: Sinus tachycardia Cannot rule out Anterior infarct , age undetermined Abnormal ECG peaked t waves seen on prior No old tracing to compare Confirmed by Deno Etienne [phone]) on 08/09/2022 9:07:22 AM  Radiology CT Head Wo Contrast  Result Date: 08/09/2022 CLINICAL DATA:  Recent syncopal episode. Complaining of chest pain and shortness of breath worsening today. EXAM: CT HEAD WITHOUT CONTRAST TECHNIQUE: Contiguous axial images were obtained from the base of the skull through the vertex without intravenous contrast. RADIATION DOSE REDUCTION: This exam was performed according to the departmental dose-optimization program which includes automated exposure control, adjustment of the mA and/or kV according to patient size and/or use of iterative reconstruction technique. COMPARISON:  None Available. FINDINGS: Brain: No evidence of acute infarction, hemorrhage, hydrocephalus, extra-axial collection or mass lesion/mass effect. Vascular: No hyperdense vessel or unexpected calcification. Skull: Normal. Negative for fracture or focal lesion. Sinuses/Orbits: Globes and orbits are unremarkable. Sinuses are clear. Other: None. IMPRESSION: Normal unenhanced CT scan of the brain.  Electronically Signed   By: Lajean Manes M.D.   On: 08/09/2022 10:10   DG Chest 2 View  Result Date: 08/08/2022 CLINICAL DATA:  Chest pain. EXAM: CHEST - 2 VIEW COMPARISON:  Chest radiograph 08/05/2022 FINDINGS: Borderline enlarged cardiac silhouette. Both lungs are clear. No pleural effusion or pneumothorax. The visualized skeletal structures are unremarkable. IMPRESSION: No acute cardiopulmonary abnormality. Electronically Signed   By: Ileana Roup M.D.   On: 08/08/2022 20:51  Procedures Procedures    Medications Ordered in ED Medications  diphenhydrAMINE (BENADRYL) injection 25 mg (25 mg Intravenous Patient Refused/Not Given 08/09/22 0939)  sodium chloride 0.9 % bolus 1,000 mL (0 mLs Intravenous Stopped 08/09/22 1128)  prochlorperazine (COMPAZINE) injection 10 mg (10 mg Intravenous Given 08/09/22 0939)  LORazepam (ATIVAN) injection 0.5 mg (0.5 mg Intravenous Given 08/09/22 0940)    ED Course/ Medical Decision Making/ A&P                           Medical Decision Making Amount and/or Complexity of Data Reviewed Labs: ordered. Radiology: ordered.  Risk Prescription drug management.   41 yo F with a cc of sob.  Going on for a year, worsening a couple days ago.  Patient suffered a fall a couple days ago. Tachypnea on exam.  Tachycardic.  We will give a bolus of IV fluids headache cocktail.  She is describing some significant headaches we will obtain a CT of the head.  She was noted to have a metabolic acidosis with anion gap.  Unknown etiology.  We will send off a VBG salicylate level, lactate EtOH.  Bolus of IV fluids  Patient's tachycardia had resolved on recheck.  Her pregnancy test was negative, VBG with likely a metabolic acidosis with hyperventilation resulting in a pH of 7.5.  Patient salicylate levels negative ethanol negative lactate negative D-dimer negative  UA with ketones.  She is doing a bit better with IV fluids suspect this is likely dehydration.  We will have  the patient increase her fluid intake at home.  Have her follow-up with her family doctor in the office.  12:29 PM:  I have discussed the diagnosis/risks/treatment options with the patient and family.  Evaluation and diagnostic testing in the emergency department does not suggest an emergent condition requiring admission or immediate intervention beyond what has been performed at this time.  They will follow up with  PCP. We also discussed returning to the ED immediately if new or worsening sx occur. We discussed the sx which are most concerning (e.g., sudden worsening sob, fever, inability to tolerate by mouth) that necessitate immediate return. Medications administered to the patient during their visit and any new prescriptions provided to the patient are listed below.  Medications given during this visit Medications  diphenhydrAMINE (BENADRYL) injection 25 mg (25 mg Intravenous Patient Refused/Not Given 08/09/22 0939)  sodium chloride 0.9 % bolus 1,000 mL (0 mLs Intravenous Stopped 08/09/22 1128)  prochlorperazine (COMPAZINE) injection 10 mg (10 mg Intravenous Given 08/09/22 0939)  LORazepam (ATIVAN) injection 0.5 mg (0.5 mg Intravenous Given 08/09/22 0940)     The patient appears reasonably screen and/or stabilized for discharge and I doubt any other medical condition or other [facility] requiring further screening, evaluation, or treatment in the ED at this time prior to discharge.            Final Clinical Impression(s) / ED Diagnoses Final diagnoses:  Shortness of breath  Metabolic acidosis  Dehydration    Rx / DC Orders ED Discharge Orders     None         Melene Plan, DO 08/09/22 1229

## 2022-08-10 ENCOUNTER — Telehealth: Payer: Self-pay

## 2022-08-10 DIAGNOSIS — O169 Unspecified maternal hypertension, unspecified trimester: Secondary | ICD-10-CM | POA: Insufficient documentation

## 2022-08-10 DIAGNOSIS — G43909 Migraine, unspecified, not intractable, without status migrainosus: Secondary | ICD-10-CM | POA: Insufficient documentation

## 2022-08-10 DIAGNOSIS — M419 Scoliosis, unspecified: Secondary | ICD-10-CM | POA: Insufficient documentation

## 2022-08-10 NOTE — Telephone Encounter (Signed)
Patient called wanted to know what is can she use for heartburn since she is a a beta blocker. Please advise

## 2022-08-10 NOTE — Telephone Encounter (Signed)
Patient made aware, Yes patient went to ED, HE told her the only thing he can find that her blood was acidity and told her to drink more fluids. She goes to see cardiologist this Friday.

## 2022-08-13 ENCOUNTER — Telehealth: Payer: Self-pay

## 2022-08-13 ENCOUNTER — Encounter: Payer: Self-pay | Admitting: Physician Assistant

## 2022-08-13 ENCOUNTER — Ambulatory Visit: Payer: Self-pay | Admitting: Physician Assistant

## 2022-08-13 VITALS — BP 118/80 | HR 103 | Temp 97.4°F | Ht 66.5 in | Wt 277.2 lb

## 2022-08-13 DIAGNOSIS — S0101XD Laceration without foreign body of scalp, subsequent encounter: Secondary | ICD-10-CM

## 2022-08-13 DIAGNOSIS — E876 Hypokalemia: Secondary | ICD-10-CM | POA: Insufficient documentation

## 2022-08-13 DIAGNOSIS — R002 Palpitations: Secondary | ICD-10-CM | POA: Insufficient documentation

## 2022-08-13 DIAGNOSIS — N3001 Acute cystitis with hematuria: Secondary | ICD-10-CM

## 2022-08-13 DIAGNOSIS — S0101XA Laceration without foreign body of scalp, initial encounter: Secondary | ICD-10-CM | POA: Insufficient documentation

## 2022-08-13 LAB — POCT URINALYSIS DIP (CLINITEK)
Bilirubin, UA: NEGATIVE
Blood, UA: NEGATIVE
Glucose, UA: NEGATIVE mg/dL
Nitrite, UA: NEGATIVE
Spec Grav, UA: 1.015 (ref 1.010–1.025)
Urobilinogen, UA: 0.2 E.U./dL
pH, UA: 7 (ref 5.0–8.0)

## 2022-08-13 MED ORDER — NITROFURANTOIN MONOHYD MACRO 100 MG PO CAPS: 100.0000 mg | ORAL_CAPSULE | Freq: Two times a day (BID) | ORAL | 0 refills | Status: DC

## 2022-08-13 NOTE — Telephone Encounter (Signed)
Patient called and stated she had to go to the  ED on and wanted you to look over her labs there, stated her platelets were low and her urine is cloudy. She [place] feeling very cold and "weird". This morning patient stated she is having lower back pain and cloudy urine. Schedule appointment for 10:40 AM for UTI

## 2022-08-13 NOTE — Progress Notes (Signed)
Subjective:  Patient ID: Angela Martin, female    DOB: 1981/04/26  Age: 41 y.o. MRN: [id number]  Chief Complaint  Patient presents with   Urinary Tract Infection    HPI  Pt in today for complaints of uti - she was seen at [facility] ED on 9/30 and had abnormal urine but not treated - she is complaining of low back pain, dysuria and urgency since yesterday She denies fever, nausea, or vomiting  She has 8 staples in scalp that need removal - had put in at ED 2 weeks ago after a fall Of note she sees cardiology tomorrow for evaluation of syncope and palpitations  At ED potassium was low on 9/30 - due to repeat Recommend to increase potassium intake in diet Current Outpatient Medications on File Prior to Visit  Medication Sig Dispense Refill   amLODipine (NORVASC) 10 MG tablet Take 1 tablet (10 mg total) by mouth daily. 90 tablet 1   aspirin EC 81 MG tablet Take 162 mg by mouth every 4 (four) hours as needed. Swallow whole.     cyclobenzaprine (FLEXERIL) 5 MG tablet TAKE 1 TABLET BY MOUTH 2 TIMES DAILY AS NEEDED FOR MUSCLE SPASMS. 60 tablet 0   metoprolol succinate (TOPROL-XL) 25 MG 24 hr tablet Take 12.5 mg by mouth daily.     No current facility-administered medications on file prior to visit.   Past Medical History:  Diagnosis Date   Acute bursitis of left shoulder 04/28/2021   Benign hypertension 08/06/2022   Bilateral carpal tunnel syndrome 04/28/2021   Cephalalgia 07/16/2020   Cervicalgia 10/09/2020   Chronic back pain 10/08/2020   Depression, major, single episode, severe ([place]) 08/20/2020   Dysuria 10/09/2020   Fibromyalgia 04/28/2021   Hypertension during pregnancy    Migraines    Panic disorder 04/28/2021   Precordial pain 08/06/2022   Scoliosis    Syncope 08/06/2022   Past Surgical History:  Procedure Laterality Date   CESAREAN SECTION     CHOLECYSTECTOMY     plantar wart removal  Right    foot   TONSILLECTOMY      Family History  Problem Relation Age of Onset   Heart  murmur Mother    Heart disease Mother    Cancer Father    GER disease Father    Colitis Father    Ovarian cancer Sister        at age 61   Other Sister        disc issues   Congestive Heart Failure Maternal Grandmother    Emphysema Paternal Grandmother    Social History   Socioeconomic History   Marital status: Married    Spouse name: Not on file   Number of children: Not on file   Years of education: Not on file   Highest education level: Not on file  Occupational History   Not on file  Tobacco Use   Smoking status: Never   Smokeless tobacco: Never  Substance and Sexual Activity   Alcohol use: Never   Drug use: Yes    Types: Marijuana   Sexual activity: Yes  Other Topics Concern   Not on file  Social History Narrative   Not on file   Social Determinants of Health   Financial Resource Strain: Not on file  Food Insecurity: Not on file  Transportation Needs: Not on file  Physical Activity: Not on file  Stress: Not on file  Social Connections: Not on file    Review of Systems  CONSTITUTIONAL: Negative for chills, fatigue, fever,  CARDIOVASCULAR: see HPI RESPIRATORY: Negative for recent cough and dyspnea.  GU - see HPI INTEGUMENTARY: see HPI NEUROLOGICAL: see HPI       Objective:  PHYSICAL EXAM:   VS: BP 118/80 (BP Location: Right Arm, Patient Position: Sitting, Cuff Size: Large)   Pulse (!) 103   Temp (!) 97.4 F (36.3 C) (Temporal)   Ht 5' 6.5" (1.689 m)   Wt 277 lb 3.2 oz (125.7 kg)   LMP 07/15/2022 (Within Weeks)   SpO2 98%   BMI 44.07 kg/m   GEN: Well nourished, well developed, in no acute distress  Cardiac: RRR; no murmurs,  Respiratory:  normal respiratory rate and pattern with no distress - normal breath sounds with no rales, rhonchi, wheezes or rubs  Skin: 8 staples removed - area healed well  Office Visit on 08/13/2022  Component Date Value Ref Range Status   Color, UA 08/13/2022 straw (A)  yellow Final   Clarity, UA 08/13/2022  cloudy (A)  clear Final   Glucose, UA 08/13/2022 negative  negative mg/dL Final   Bilirubin, UA 76/16/0737 negative  negative Final   Ketones, POC UA 08/13/2022 moderate (40) (A)  negative mg/dL Final   Spec Grav, UA 10/62/6948 1.015  1.010 - 1.025 Final   Blood, UA 08/13/2022 negative  negative Final   pH, UA 08/13/2022 7.0  5.0 - 8.0 Final   POC PROTEIN,UA 08/13/2022 trace  negative, trace Final   Urobilinogen, UA 08/13/2022 0.2  0.2 or 1.0 E.U./dL Final   Nitrite, UA 54/62/7035 Negative  Negative Final   Leukocytes, UA 08/13/2022 Large (3+) (A)  Negative Final     Lab Results  Component Value Date   WBC 12.2 (H) 08/08/2022   HGB 13.9 08/09/2022   HCT 41.0 08/09/2022   PLT 418 (H) 08/08/2022   GLUCOSE 95 08/08/2022   ALT 15 07/10/2021   AST 16 07/10/2021   NA 137 08/09/2022   K 4.5 08/09/2022   CL 106 08/08/2022   CREATININE 0.77 08/08/2022   BUN 15 08/08/2022   CO2 16 (L) 08/08/2022   TSH 1.164 08/09/2022      Assessment & Plan:   Problem List Items Addressed This Visit       Genitourinary   Acute cystitis with hematuria   Relevant Orders   Urine Culture   POCT URINALYSIS DIP (CLINITEK) Rx macrobid as directed     Other   Hypokalemia - Primary   Relevant Orders   Comprehensive metabolic panel Increase potassium in diet   Scalp laceration 8 staples removed without complication   Palpitations Follow up with cardiology as directed  .  Meds ordered this encounter  Medications   nitrofurantoin, macrocrystal-monohydrate, (MACROBID) 100 MG capsule    Sig: Take 1 capsule (100 mg total) by mouth 2 (two) times daily.    Dispense:  20 capsule    Refill:  0    Order Specific Question:   Supervising Provider    AnswerCorey Harold    Orders Placed This Encounter  Procedures   Urine Culture   Comprehensive metabolic panel   POCT URINALYSIS DIP (CLINITEK)     Follow-up: Return if symptoms worsen or fail to improve.  An After Visit Summary  was printed and given to the patient.  Jettie Pagan [facility] [phone]

## 2022-08-14 ENCOUNTER — Ambulatory Visit: Payer: Self-pay | Admitting: Cardiology

## 2022-08-14 LAB — URINE CULTURE

## 2022-08-14 LAB — COMPREHENSIVE METABOLIC PANEL
ALT: 18 IU/L (ref 0–32)
AST: 18 IU/L (ref 0–40)
Albumin/Globulin Ratio: 1.7 (ref 1.2–2.2)
Albumin: 4.6 g/dL (ref 3.9–4.9)
Alkaline Phosphatase: 101 IU/L (ref 44–121)
BUN/Creatinine Ratio: 11 (ref 9–23)
BUN: 8 mg/dL (ref 6–20)
Bilirubin Total: 0.3 mg/dL (ref 0.0–1.2)
CO2: 20 mmol/L (ref 20–29)
Calcium: 9.8 mg/dL (ref 8.7–10.2)
Chloride: 102 mmol/L (ref 96–106)
Creatinine, Ser: 0.71 mg/dL (ref 0.57–1.00)
Globulin, Total: 2.7 g/dL (ref 1.5–4.5)
Glucose: 99 mg/dL (ref 70–99)
Potassium: 4.3 mmol/L (ref 3.5–5.2)
Sodium: 138 mmol/L (ref 134–144)
Total Protein: 7.3 g/dL (ref 6.0–8.5)
eGFR: 112 mL/min/{1.73_m2} (ref >59)

## 2022-08-17 ENCOUNTER — Ambulatory Visit: Payer: Self-pay | Admitting: Physician Assistant

## 2022-08-24 ENCOUNTER — Telehealth: Payer: Self-pay

## 2022-08-24 NOTE — Telephone Encounter (Signed)
Patient called and stated that when she took her amlodipine last night that her BP went up to 100/58 and she felt like she was going to pass out. She takes this at night due to she takes metoprolol in the AM. She wants to know what  she should do. Please advise.

## 2022-08-24 NOTE — Telephone Encounter (Signed)
What is her bp today? - what has it been running? If normal or low can hold amlodopine until she sees cardiology and continue metoprolol

## 2022-08-24 NOTE — Telephone Encounter (Signed)
Patient stated that her BP 115/73 at 12 pm, patient was made aware to hold amlodipine and continue metoprolol until she sees cardiologist Friday.

## 2022-08-24 NOTE — Telephone Encounter (Signed)
She sees cardio friday

## 2022-08-27 NOTE — Progress Notes (Signed)
Cardiology Office Note:    Date:  08/28/2022   ID:  Angela Martin, DOB 1981-03-19, MRN [id number]  PCP:  Marge Duncans, PA-C  Cardiologist:  Shirlee More, MD   Referring MD: Marge Duncans, PA-C  ASSESSMENT:    1. Syncope and collapse   2. Benign hypertension    PLAN:    In order of problems listed above:  Clinically she had orthostatic induced hypotension in the context of 2 medications with strong postural shifts in blood pressure both her antihypertensive as well as Flexeril which has an alpha adrenergic blocking effect.  At this time I think she should come off all antihypertensive agents trend her heart rates and blood pressures at home and asked her to activate her [employer] watch to trend heart rates and capture episodes if she has tachycardia.  If an antihypertensive agent is needed I think she would do well with a very low-dose ARB like losartan being careful not to get systolic blood pressure standing less than 110 I encouraged her to continue her support hose. For further evaluation a 7-day event monitor as well as an echocardiogram with her edema and family history of premature heart disease. I asked her to see her family doctor in 1 week to review those blood pressures.  I will see her back in the office in about 6 weeks Continue support hose that I think is overall quite beneficial for her with syncope and peripheral edema Once recovered she is interested in using injectable GLP-1 agents for weight loss  Next appointment   Medication Adjustments/Labs and Tests Ordered: Current medicines are reviewed at length with the patient today.  Concerns regarding medicines are outlined above.  No orders of the defined types were placed in this encounter.  No orders of the defined types were placed in this encounter.   Chief complaint plaint I am here because of fainting   History of Present Illness:    Shephanie Martin is a 41 y.o. female who is being seen today for the  evaluation of syncope at the request of Marge Duncans, Hershal Coria.  She was seen in [facility]: ED 08/08/2022 which shortness of breath over period of approximately 1 year worsened in the preceding few days.  Laboratory studies showed mild hypokalemia 3.2 normal renal function she had microcytic indices but a normal hemoglobin 13.0 high-sensitivity troponin was normal chest x-ray was read as normal D-dimer was low not indicative of pulmonary embolism she had a CT of the head performed which was read as normal and EKG showed sinus tachycardia read as cannot rule out anterior MI.  She was tachycardic in the ED and was given IV fluids she had a metabolic acidosis of uncertain etiology her tachycardia resolved and felt to be safe for discharge follow-up with her primary care physician.  This is a complex case.  She had been on antihypertensive amlodipine for about 6 months and she takes Flexeril as needed both drugs can have a marked orthostatic shift and blood pressure can cause reflex tachycardia she was also taking gabapentin and developed marked lower extremity edema.  She has had 2 orthostatic induced syncopal episodes.  She has had warning but not enough time to get herself to the ground.  Her husband was home and it was brief she quickly recovered no ictal activity but she had a scalp laceration.  It also occurred in the context of an untreated urinary tract infection.  She never had episodes earlier in life.  In the midst of all  this she has been put on a beta-blocker and feels very badly weakness fatigue and has difficulty with urination.  She has a validated blood pressure device educated in good technique and encouraged her to check her blood pressure twice daily sitting and standing record and bring with her to office visits  She is not having chest pain edema is decreased and is not having palpitation  She had a mild case of COVID about 2 years ago and completely recovered  Her mother has a history of  paroxysmal atrial fibrillation and syncope maternal grandmother had atrial fibrillation and a uncle on her mother side with early in life CAD in his 74s and has required a pacemaker.  There is no family history of sudden cardiac death or cardiomyopathy.  She takes no over-the-counter proarrhythmic medications  She feels better since she stopped taking amlodipine 1 week ago and no longer is taking Flexeril.  She has no history of head trauma or seizure disorder  Her mother is concerned she may have POTS Past Medical History:  Diagnosis Date   Acute bursitis of left shoulder 04/28/2021   Benign hypertension 08/06/2022   Bilateral carpal tunnel syndrome 04/28/2021   Cephalalgia 07/16/2020   Cervicalgia 10/09/2020   Chronic back pain 10/08/2020   Depression, major, single episode, severe ([place]) 08/20/2020   Dysuria 10/09/2020   Fibromyalgia 04/28/2021   Hypertension during pregnancy    Migraines    Panic disorder 04/28/2021   Precordial pain 08/06/2022   Scoliosis    Syncope 08/06/2022    Past Surgical History:  Procedure Laterality Date   CESAREAN SECTION     CHOLECYSTECTOMY     plantar wart removal  Right    foot   TONSILLECTOMY      Current Medications: Current Meds  Medication Sig   aspirin EC 81 MG tablet Take 162 mg by mouth every 4 (four) hours as needed. Swallow whole.   cyclobenzaprine (FLEXERIL) 5 MG tablet TAKE 1 TABLET BY MOUTH 2 TIMES DAILY AS NEEDED FOR MUSCLE SPASMS.   metoprolol succinate (TOPROL-XL) 25 MG 24 hr tablet Take 12.5 mg by mouth daily.     Allergies:   Amoxicillin, Morphine, Oxycodone, Penicillin g, Morphine and related, and Other   Social History   Socioeconomic History   Marital status: Married    Spouse name: Not on file   Number of children: Not on file   Years of education: Not on file   Highest education level: Not on file  Occupational History   Not on file  Tobacco Use   Smoking status: Never   Smokeless tobacco: Never  Substance and  Sexual Activity   Alcohol use: Never   Drug use: Yes    Types: Marijuana   Sexual activity: Yes  Other Topics Concern   Not on file  Social History Narrative   Not on file   Social Determinants of Health   Financial Resource Strain: Not on file  Food Insecurity: Not on file  Transportation Needs: Not on file  Physical Activity: Not on file  Stress: Not on file  Social Connections: Not on file     Family History: The patient's family history includes Cancer in her father; Colitis in her father; Congestive Heart Failure in her maternal grandmother; Emphysema in her paternal grandmother; GER disease in her father; Heart disease in her mother; Heart murmur in her mother; Other in her sister; Ovarian cancer in her sister.  ROS:   ROS Please see the history of  present illness.     All other systems reviewed and are negative.  EKGs/Labs/Other Studies Reviewed:    The following studies were reviewed today:   EKG:  EKG is  ordered today.  The ekg ordered today is personally reviewed and demonstrates sinus rhythm and send normal EKG  Recent Labs: 08/08/2022: Platelets 418 08/09/2022: Hemoglobin 13.9; TSH 1.164 08/13/2022: ALT 18; BUN 8; Creatinine, Ser 0.71; Potassium 4.3; Sodium 138  Recent Lipid Panel No results found for: "CHOL", "TRIG", "HDL", "CHOLHDL", "VLDL", "LDLCALC", "LDLDIRECT"  Physical Exam:    VS:  Ht 5\' 6"  (1.676 m)   Wt 276 lb (125.2 kg)   LMP 07/15/2022 (Within Weeks)   SpO2 97%   BMI 44.55 kg/m     Wt Readings from Last 3 Encounters:  08/28/22 276 lb (125.2 kg)  08/13/22 277 lb 3.2 oz (125.7 kg)  02/23/22 283 lb (128.4 kg)     GEN:  Well nourished, well developed in no acute distress HEENT: Normal NECK: No JVD; No carotid bruits LYMPHATICS: No lymphadenopathy CARDIAC: RRR, no murmurs, rubs, gallops RESPIRATORY:  Clear to auscultation without rales, wheezing or rhonchi  ABDOMEN: Soft, non-tender, non-distended MUSCULOSKELETAL:  No edema; No deformity  she is wearing support hose SKIN: Warm and dry NEUROLOGIC:  Alert and oriented x 3 PSYCHIATRIC:  Normal affect     Signed, Shirlee More, MD  08/28/2022 8:46 AM    [place]

## 2022-08-28 ENCOUNTER — Encounter: Payer: Self-pay | Admitting: Cardiology

## 2022-08-28 ENCOUNTER — Ambulatory Visit: Payer: Self-pay | Attending: Cardiology | Admitting: Cardiology

## 2022-08-28 ENCOUNTER — Ambulatory Visit: Payer: Self-pay | Attending: Cardiology

## 2022-08-28 VITALS — Ht 66.0 in | Wt 276.0 lb

## 2022-08-28 DIAGNOSIS — I10 Essential (primary) hypertension: Secondary | ICD-10-CM

## 2022-08-28 DIAGNOSIS — R55 Syncope and collapse: Secondary | ICD-10-CM

## 2022-08-28 NOTE — Patient Instructions (Addendum)
Medication Instructions:  Your physician has recommended you make the following change in your medication:   Stop Toprol XL  *If you need a refill on your cardiac medications before your next appointment, please call your pharmacy*   Lab Work: None ordered If you have labs (blood work) drawn today and your tests are completely normal, you will receive your results only by: MyChart Message (if you have MyChart) OR A paper copy in the mail If you have any lab test that is abnormal or we need to change your treatment, we will call you to review the results.   Testing/Procedures: Your physician has requested that you have an echocardiogram. Echocardiography is a painless test that uses sound waves to create images of your heart. It provides your doctor with information about the size and shape of your heart and how well your heart's chambers and valves are working. This procedure takes approximately one hour. There are no restrictions for this procedure.   WHY IS MY DOCTOR PRESCRIBING ZIO? The Zio system is proven and trusted by physicians to detect and diagnose irregular heart rhythms -- and has been prescribed to hundreds of thousands of patients.  The FDA has cleared the Zio system to monitor for many different kinds of irregular heart rhythms. In a study, physicians were able to reach a diagnosis 90% of the time with the Zio system1.  You can wear the Zio monitor -- a small, discreet, comfortable patch -- during your normal day-to-day activity, including while you sleep, shower, and exercise, while it records every single heartbeat for analysis.  1Barrett, P., et al. Comparison of 24 Hour Holter Monitoring Versus 14 Day Novel Adhesive Patch Electrocardiographic Monitoring. American Journal of Medicine, 2014.  ZIO VS. HOLTER MONITORING The Zio monitor can be comfortably worn for up to 14 days. Holter monitors can be worn for 24 to 48 hours, limiting the time to record any irregular heart  rhythms you may have. Zio is able to capture data for the 51% of patients who have their first symptom-triggered arrhythmia after 48 hours.1  LIVE WITHOUT RESTRICTIONS The Zio ambulatory cardiac monitor is a small, unobtrusive, and water-resistant patch--you might even forget you're wearing it. The Zio monitor records and stores every beat of your heart, whether you're sleeping, working out, or showering. Wear the monitor for 7 days and remove 09/04/22.    Follow-Up: At [facility], you and your health needs are our priority.  As part of our continuing mission to provide you with exceptional heart care, we have created designated Provider Care Teams.  These Care Teams include your primary Cardiologist (physician) and Advanced Practice Providers (APPs -  Physician Assistants and Nurse Practitioners) who all work together to provide you with the care you need, when you need it.  We recommend signing up for the patient portal called "MyChart".  Sign up information is provided on this After Visit Summary.  MyChart is used to connect with patients for Virtual Visits (Telemedicine).  Patients are able to view lab/test results, encounter notes, upcoming appointments, etc.  Non-urgent messages can be sent to your provider as well.   To learn more about what you can do with MyChart, go to [email or web address].    Your next appointment:   6 week(s)  The format for your next appointment:   In Person  Provider:   Norman Herrlich, MD   Other Instructions Echocardiogram An echocardiogram is a test that uses sound waves (ultrasound) to produce images of the heart. Images  from an echocardiogram can provide important information about: Heart size and shape. The size and thickness and movement of your heart's walls. Heart muscle function and strength. Heart valve function or if you have stenosis. Stenosis is when the heart valves are too narrow. If blood is flowing backward through the heart valves  (regurgitation). A tumor or infectious growth around the heart valves. Areas of heart muscle that are not working well because of poor blood flow or injury from a heart attack. Aneurysm detection. An aneurysm is a weak or damaged part of an artery wall. The wall bulges out from the normal force of blood pumping through the body. Tell a health care provider about: Any allergies you have. All medicines you are taking, including vitamins, herbs, eye drops, creams, and over-the-counter medicines. Any blood disorders you have. Any surgeries you have had. Any medical conditions you have. Whether you are pregnant or may be pregnant. What are the risks? Generally, this is a safe test. However, problems may occur, including an allergic reaction to dye (contrast) that may be used during the test. What happens before the test? No specific preparation is needed. You may eat and drink normally. What happens during the test? You will take off your clothes from the waist up and put on a hospital gown. Electrodes or electrocardiogram (ECG)patches may be placed on your chest. The electrodes or patches are then connected to a device that monitors your heart rate and rhythm. You will lie down on a table for an ultrasound exam. A gel will be applied to your chest to help sound waves pass through your skin. A handheld device, called a transducer, will be pressed against your chest and moved over your heart. The transducer produces sound waves that travel to your heart and bounce back (or "echo" back) to the transducer. These sound waves will be captured in real-time and changed into images of your heart that can be viewed on a video monitor. The images will be recorded on a computer and reviewed by your health care provider. You may be asked to change positions or hold your breath for a short time. This makes it easier to get different views or better views of your heart. In some cases, you may receive contrast  through an IV in one of your veins. This can improve the quality of the pictures from your heart. The procedure may vary among health care providers and hospitals.   What can I expect after the test? You may return to your normal, everyday life, including diet, activities, and medicines, unless your health care provider tells you not to do that. Follow these instructions at home: It is up to you to get the results of your test. Ask your health care provider, or the department that is doing the test, when your results will be ready. Keep all follow-up visits. This is important. Summary An echocardiogram is a test that uses sound waves (ultrasound) to produce images of the heart. Images from an echocardiogram can provide important information about the size and shape of your heart, heart muscle function, heart valve function, and other possible heart problems. You do not need to do anything to prepare before this test. You may eat and drink normally. After the echocardiogram is completed, you may return to your normal, everyday life, unless your health care provider tells you not to do that. This information is not intended to replace advice given to you by your health care provider. Make sure you discuss  any questions you have with your health care provider. Document Revised: 06/18/2020 Document Reviewed: 06/18/2020 Elsevier Patient Education  2021 [place].  Take BP/HR sitting and standing twice daily and record.  Continue support hose  Setup smart watch  1. Avoid all over-the-counter antihistamines except Claritin/Loratadine and Zyrtec/Cetrizine. 2. Avoid all combination including cold sinus allergies flu decongestant and sleep medications 3. You can use Robitussin DM Mucinex and Mucinex DM for cough. 4. can use Tylenol aspirin ibuprofen and naproxen but no combinations such as sleep or sinus.   [employer]  Tips to measure your blood pressure correctly  To determine whether you  have hypertension, a medical professional will take a blood pressure reading. How you prepare for the test, the position of your arm, and other factors can change a blood pressure reading by 10% or more. That could be enough to hide high blood pressure, start you on a drug you don't really need, or lead your doctor to incorrectly adjust your medications. National and international guidelines offer specific instructions for measuring blood pressure. If a doctor, nurse, or medical assistant isn't doing it right, don't hesitate to ask him or her to get with the guidelines. Here's what you can do to ensure a correct reading:  Don't drink a caffeinated beverage or smoke during the 30 minutes before the test.  Sit quietly for five minutes before the test begins.  During the measurement, sit in a chair with your feet on the floor and your arm supported so your elbow is at about heart level.  The inflatable part of the cuff should completely cover at least 80% of your upper arm, and the cuff should be placed on bare skin, not over a shirt.  Don't talk during the measurement.  Have your blood pressure measured twice, with a brief break in between. If the readings are different by 5 points or more, have it done a third time. There are times to break these rules. If you sometimes feel lightheaded when getting out of bed in the morning or when you stand after sitting, you should have your blood pressure checked while seated and then while standing to see if it falls from one position to the next. Because blood pressure varies throughout the day, your doctor will rarely diagnose hypertension on the basis of a single reading. Instead, he or she will want to confirm the measurements on at least two occasions, usually within a few weeks of one another. The exception to this rule is if you have a blood pressure reading of 180/110 mm Hg or higher. A result this high usually calls for prompt treatment. It's also a good idea  to have your blood pressure measured in both arms at least once, since the reading in one arm (usually the right) may be higher than that in the left. A 2014 study in The American Journal of Medicine of nearly 3,400 people found average arm- to-arm differences in systolic blood pressure of about 5 points. The higher number should be used to make treatment decisions. In 2017, new guidelines from the [place], the [employer] of Cardiology, and nine other health organizations lowered the diagnosis of high blood pressure to 130/80 mm Hg or higher for all adults. The guidelines also redefined the various blood pressure categories to now include normal, elevated, Stage 1 hypertension, Stage 2 hypertension, and hypertensive crisis (see "Blood pressure categories"). Blood pressure categories  Blood pressure category SYSTOLIC (upper number)  DIASTOLIC (lower number)  Normal  Less than 120 mm Hg and Less than 80 mm Hg  Elevated 120-129 mm Hg and Less than 80 mm Hg  High blood pressure: Stage 1 hypertension 130-139 mm Hg or 80-89 mm Hg  High blood pressure: Stage 2 hypertension 140 mm Hg or higher or 90 mm Hg or higher  Hypertensive crisis (consult your doctor immediately) Higher than 180 mm Hg and/or Higher than 120 mm Hg  Source: American Heart Association and American Stroke Association. For more on getting your blood pressure under control, buy Controlling Your Blood Pressure, a Special Health Report from [facility].

## 2022-09-02 ENCOUNTER — Ambulatory Visit: Payer: Self-pay | Admitting: Physician Assistant

## 2022-09-02 ENCOUNTER — Encounter: Payer: Self-pay | Admitting: Physician Assistant

## 2022-09-02 VITALS — BP 110/80 | HR 96 | Temp 97.3°F | Ht 66.0 in | Wt 270.2 lb

## 2022-09-02 DIAGNOSIS — R002 Palpitations: Secondary | ICD-10-CM

## 2022-09-02 DIAGNOSIS — N3000 Acute cystitis without hematuria: Secondary | ICD-10-CM

## 2022-09-02 LAB — POCT URINALYSIS DIP (CLINITEK)
Bilirubin, UA: NEGATIVE
Blood, UA: NEGATIVE
Glucose, UA: NEGATIVE mg/dL
Nitrite, UA: NEGATIVE
Spec Grav, UA: 1.025 (ref 1.010–1.025)
Urobilinogen, UA: 0.2 E.U./dL
pH, UA: 5 (ref 5.0–8.0)

## 2022-09-02 MED ORDER — CIPROFLOXACIN HCL 500 MG PO TABS: 500.0000 mg | ORAL_TABLET | Freq: Two times a day (BID) | ORAL | 0 refills | Status: AC

## 2022-09-02 NOTE — Progress Notes (Signed)
Subjective:  Patient ID: Angela Martin, female    DOB: 1980-11-26  Age: 41 y.o. MRN: [id number]  Chief Complaint  Patient presents with   Hypertension    Cardio 1 week F/U    HPI  Pt in today for follow up of blood pressure and pulse. In 4/23 pt was seen at ED and prescribed norvasc qd for hypertension.  She did not follow up in our office until after another ED visit in 9/23.  Pt had syncopal episode and bp and pulse remained elevated.  At that time metoprolol 25mg  1/2 po qd was given as well.  Because of her syncope and hypertension she was referred to cardiology.  She has zio patch on today and is scheduled for echocardiogram.  She was advised by cardiology to stop both metoprolol and norvasc Pt has been keeping log of bp this past week since stopping medication and bp has remained stable however pulse has ranged from 80s-150s at times per patient Pt states she has changed her diet drastically and stopped smoking marijuana.  Pt complains of dysuria and urgency for past few days.  Denies fever Current Outpatient Medications on File Prior to Visit  Medication Sig Dispense Refill   aspirin EC 81 MG tablet Take 162 mg by mouth every 4 (four) hours as needed. Swallow whole.     No current facility-administered medications on file prior to visit.   Past Medical History:  Diagnosis Date   Acute bursitis of left shoulder 04/28/2021   Benign hypertension 08/06/2022   Bilateral carpal tunnel syndrome 04/28/2021   Cephalalgia 07/16/2020   Cervicalgia 10/09/2020   Chronic back pain 10/08/2020   Depression, major, single episode, severe ([place]) 08/20/2020   Dysuria 10/09/2020   Fibromyalgia 04/28/2021   Hypertension during pregnancy    Migraines    Panic disorder 04/28/2021   Precordial pain 08/06/2022   Scoliosis    Syncope 08/06/2022   Past Surgical History:  Procedure Laterality Date   CESAREAN SECTION     CHOLECYSTECTOMY     plantar wart removal  Right    foot   TONSILLECTOMY       Family History  Problem Relation Age of Onset   Heart murmur Mother    Heart disease Mother    Cancer Father    GER disease Father    Colitis Father    Ovarian cancer Sister        at age 22   Other Sister        disc issues   Congestive Heart Failure Maternal Grandmother    Emphysema Paternal Grandmother    Social History   Socioeconomic History   Marital status: Married    Spouse name: Not on file   Number of children: Not on file   Years of education: Not on file   Highest education level: Not on file  Occupational History   Not on file  Tobacco Use   Smoking status: Never   Smokeless tobacco: Never  Substance and Sexual Activity   Alcohol use: Never   Drug use: Yes    Types: Marijuana   Sexual activity: Yes  Other Topics Concern   Not on file  Social History Narrative   Not on file   Social Determinants of Health   Financial Resource Strain: Not on file  Food Insecurity: Not on file  Transportation Needs: Not on file  Physical Activity: Not on file  Stress: Not on file  Social Connections: Not on file  Review of Systems CONSTITUTIONAL: Negative for chills, fatigue, fever,  CARDIOVASCULAR: see HPI RESPIRATORY: Negative for recent cough and dyspnea.  GASTROINTESTINAL: Negative for abdominal pain, acid reflux symptoms, constipation, diarrhea, nausea and vomiting.  GU - see HPI INTEGUMENTARY: Negative for rash.        Objective:  PHYSICAL EXAM:   VS: BP 110/80 (BP Location: Left Arm, Cuff Size: Large)   Pulse 96   Temp (!) 97.3 F (36.3 C) (Temporal)   Ht 5\' 6"  (1.676 m)   Wt 270 lb 3.2 oz (122.6 kg)   LMP 07/15/2022 (Within Weeks)   SpO2 99%   BMI 43.61 kg/m   GEN: Well nourished, well developed, in no acute distress  Cardiac: RRR; no murmurs, rubs, or gallops,no edema -  Respiratory:  normal respiratory rate and pattern with no distress - normal breath sounds with no rales, rhonchi, wheezes or rubs Psych: euthymic mood, appropriate  affect and demeanor  Office Visit on 09/02/2022  Component Date Value Ref Range Status   Color, UA 09/02/2022 yellow  yellow Final   Clarity, UA 09/02/2022 clear  clear Final   Glucose, UA 09/02/2022 negative  negative mg/dL Final   Bilirubin, UA 09/04/2022 negative  negative Final   Ketones, POC UA 09/02/2022 trace (5) (A)  negative mg/dL Final   Spec Grav, UA 09/04/2022 1.025  1.010 - 1.025 Final   Blood, UA 09/02/2022 negative  negative Final   pH, UA 09/02/2022 5.0  5.0 - 8.0 Final   POC PROTEIN,UA 09/02/2022 trace  negative, trace Final   Urobilinogen, UA 09/02/2022 0.2  0.2 or 1.0 E.U./dL Final   Nitrite, UA 09/04/2022 Negative  Negative Final   Leukocytes, UA 09/02/2022 Large (3+) (A)  Negative Final    Lab Results  Component Value Date   WBC 12.2 (H) 08/08/2022   HGB 13.9 08/09/2022   HCT 41.0 08/09/2022   PLT 418 (H) 08/08/2022   GLUCOSE 99 08/13/2022   ALT 18 08/13/2022   AST 18 08/13/2022   NA 138 08/13/2022   K 4.3 08/13/2022   CL 102 08/13/2022   CREATININE 0.71 08/13/2022   BUN 8 08/13/2022   CO2 20 08/13/2022   TSH 1.164 08/09/2022      Assessment & Plan:   Problem List Items Addressed This Visit       Other   UTI Rx for cipro as directed Follow up if symptoms change or worsen   Palpitations Continue to monitor bp and pulse -- will not restart any medication at this time Follow up with cardiology as directed  .  No orders of the defined types were placed in this encounter.   Orders Placed This Encounter  Procedures   POCT URINALYSIS DIP (CLINITEK)     Follow-up: Return if symptoms worsen or fail to improve, for after cardiology consult.  An After Visit Summary was printed and given to the patient.  10/09/2022 [facility] [phone]

## 2022-09-09 ENCOUNTER — Ambulatory Visit: Payer: Medicaid Other | Attending: Cardiology

## 2022-09-09 DIAGNOSIS — R0602 Shortness of breath: Secondary | ICD-10-CM | POA: Insufficient documentation

## 2022-09-09 DIAGNOSIS — R072 Precordial pain: Secondary | ICD-10-CM | POA: Insufficient documentation

## 2022-09-09 DIAGNOSIS — I10 Essential (primary) hypertension: Secondary | ICD-10-CM | POA: Insufficient documentation

## 2022-09-09 DIAGNOSIS — R55 Syncope and collapse: Secondary | ICD-10-CM | POA: Insufficient documentation

## 2022-09-09 LAB — ECHOCARDIOGRAM COMPLETE
Area-P 1/2: 5.88 cm2
S' Lateral: 3.05 cm

## 2022-09-21 ENCOUNTER — Telehealth: Payer: Self-pay

## 2022-09-21 NOTE — Telephone Encounter (Signed)
She scheduled an appointment to follow-up with you next week.  She said she is having more muscle pain and tightness since stopping the flexeril.  She said this was stopped when her bp dropped some by cardiology but when she followed up with you, it was determined that this was probably not the reason.

## 2022-09-21 NOTE — Telephone Encounter (Signed)
Angela Martin called requesting that her cyclobenzaprine be restarted.  She is complaining of fibromyalgia pain.  She also wanted to make provider aware that her OTC GERD medication is not helping and she is concerned about that she needs further work-up.  Call back number [phone].

## 2022-10-06 ENCOUNTER — Ambulatory Visit: Payer: Medicaid Other | Admitting: Physician Assistant

## 2022-10-11 DIAGNOSIS — H9202 Otalgia, left ear: Secondary | ICD-10-CM | POA: Diagnosis not present

## 2022-10-11 DIAGNOSIS — N3000 Acute cystitis without hematuria: Secondary | ICD-10-CM | POA: Diagnosis not present

## 2022-10-11 DIAGNOSIS — R300 Dysuria: Secondary | ICD-10-CM | POA: Diagnosis not present

## 2022-10-11 DIAGNOSIS — R350 Frequency of micturition: Secondary | ICD-10-CM | POA: Diagnosis not present

## 2022-10-11 NOTE — Progress Notes (Unsigned)
Cardiology Office Note:    Date:  10/12/2022   ID:  Angela Martin, DOB 04-19-1981, MRN [id number]  PCP:  Marianne Sofia, PA-C  Cardiologist:  Norman Herrlich, MD    Referring MD: Marianne Sofia, PA-C    ASSESSMENT:    1. Syncope and collapse   2. Benign hypertension    PLAN:    In order of problems listed above:  In retrospect related to medications both antihypertensive as well as Flexeril and presently does not require pharmacologic therapy for hypertension. Strongly encouraged to continue her current lifestyle modification goal of weight loss sodium restriction physical activity to continue to trend and record blood pressures at home and I will see back in the office again in the future as needed   Next appointment: As needed   Medication Adjustments/Labs and Tests Ordered: Current medicines are reviewed at length with the patient today.  Concerns regarding medicines are outlined above.  No orders of the defined types were placed in this encounter.  No orders of the defined types were placed in this encounter.   Chief Complaint  Patient presents with   Follow-up   Loss of Consciousness   Hypertension    History of Present Illness:    Angela Martin is a 41 y.o. female with a hx of symptomatic orthostatic hypotension with Flexeril therapy and ED initiated antihypertensive medication last seen 08/28/2022.Marland Kitchen  She had an echocardiogram performed 09/09/2022 left ventricle normal in size wall thickness systolic diastolic function right ventricle normal size and function and no valvular abnormality.  Her event monitor report 09/15/2022 showed rare ventricular and supraventricular ectopy and triggered and diary events were associated with sinus rhythm and sinus tachycardia.  Compliance with diet, lifestyle and medications: Yes  She is doing much better sodium restricting activity about 5000 steps a day feels better and blood pressures run from 120-125/80-85 with home without  medications. We reviewed the results of her echocardiogram and monitor both normal Past Medical History:  Diagnosis Date   Acute bursitis of left shoulder 04/28/2021   Benign hypertension 08/06/2022   Bilateral carpal tunnel syndrome 04/28/2021   Cephalalgia 07/16/2020   Cervicalgia 10/09/2020   Chronic back pain 10/08/2020   Depression, major, single episode, severe ([facility]) 08/20/2020   Dysuria 10/09/2020   Fibromyalgia 04/28/2021   Hypertension during pregnancy    Migraines    Panic disorder 04/28/2021   Precordial pain 08/06/2022   Scoliosis    Syncope 08/06/2022    Past Surgical History:  Procedure Laterality Date   CESAREAN SECTION     CHOLECYSTECTOMY     plantar wart removal  Right    foot   TONSILLECTOMY      Current Medications: Current Meds  Medication Sig   aspirin EC 81 MG tablet Take 81 mg by mouth once. Swallow whole.   ciprofloxacin (CIPRO) 250 MG tablet Take 250 mg by mouth 2 (two) times daily. X 5 DAYS   Garlic 500 MG CAPS Take 500 mg by mouth daily.     Allergies:   Amoxicillin, Morphine, Oxycodone, Penicillin g, Morphine and related, and Other   Social History   Socioeconomic History   Marital status: Married    Spouse name: Not on file   Number of children: Not on file   Years of education: Not on file   Highest education level: Not on file  Occupational History   Not on file  Tobacco Use   Smoking status: Never   Smokeless tobacco: Never  Substance and Sexual  Activity   Alcohol use: Never   Drug use: Yes    Types: Marijuana   Sexual activity: Yes  Other Topics Concern   Not on file  Social History Narrative   Not on file   Social Determinants of Health   Financial Resource Strain: Not on file  Food Insecurity: Not on file  Transportation Needs: Not on file  Physical Activity: Not on file  Stress: Not on file  Social Connections: Not on file     Family History: The patient's family history includes Cancer in her father; Colitis in her  father; Congestive Heart Failure in her maternal grandmother; Emphysema in her paternal grandmother; GER disease in her father; Heart disease in her mother; Heart murmur in her mother; Other in her sister; Ovarian cancer in her sister. ROS:   Please see the history of present illness.    All other systems reviewed and are negative.  EKGs/Labs/Other Studies Reviewed:    The following studies were reviewed today:    Recent Labs: 08/08/2022: Platelets 418 08/09/2022: Hemoglobin 13.9; TSH 1.164 08/13/2022: ALT 18; BUN 8; Creatinine, Ser 0.71; Potassium 4.3; Sodium 138  Recent Lipid Panel No results found for: "CHOL", "TRIG", "HDL", "CHOLHDL", "VLDL", "LDLCALC", "LDLDIRECT"  Physical Exam:    VS:  BP 122/88 (BP Location: Left Arm, Patient Position: Sitting, Cuff Size: Large)   Pulse 94   Ht 5\' 6"  (1.676 m)   Wt 270 lb (122.5 kg)   SpO2 98%   BMI 43.58 kg/m     Wt Readings from Last 3 Encounters:  10/12/22 270 lb (122.5 kg)  09/02/22 270 lb 3.2 oz (122.6 kg)  08/28/22 276 lb (125.2 kg)     GEN:  Well nourished, well developed in no acute distress HEENT: Normal NECK: No JVD; No carotid bruits LYMPHATICS: No lymphadenopathy CARDIAC: RRR, no murmurs, rubs, gallops RESPIRATORY:  Clear to auscultation without rales, wheezing or rhonchi  ABDOMEN: Soft, non-tender, non-distended MUSCULOSKELETAL:  No edema; No deformity  SKIN: Warm and dry NEUROLOGIC:  Alert and oriented x 3 PSYCHIATRIC:  Normal affect    Signed, 08/30/22, MD  10/12/2022 9:37 AM    [facility]

## 2022-10-12 ENCOUNTER — Ambulatory Visit: Payer: Medicaid Other | Attending: Cardiology | Admitting: Cardiology

## 2022-10-12 ENCOUNTER — Encounter: Payer: Self-pay | Admitting: Cardiology

## 2022-10-12 VITALS — BP 122/88 | HR 94 | Ht 66.0 in | Wt 270.0 lb

## 2022-10-12 DIAGNOSIS — R55 Syncope and collapse: Secondary | ICD-10-CM | POA: Diagnosis not present

## 2022-10-12 DIAGNOSIS — I10 Essential (primary) hypertension: Secondary | ICD-10-CM

## 2022-10-12 NOTE — Patient Instructions (Addendum)
Medication Instructions:  Your physician recommends that you continue on your current medications as directed. Please refer to the Current Medication list given to you today.  *If you need a refill on your cardiac medications before your next appointment, please call your pharmacy*   Lab Work: None If you have labs (blood work) drawn today and your tests are completely normal, you will receive your results only by: MyChart Message (if you have MyChart) OR A paper copy in the mail If you have any lab test that is abnormal or we need to change your treatment, we will call you to review the results.   Testing/Procedures: None   Follow-Up: At [facility], you and your health needs are our priority.  As part of our continuing mission to provide you with exceptional heart care, we have created designated Provider Care Teams.  These Care Teams include your primary Cardiologist (physician) and Advanced Practice Providers (APPs -  Physician Assistants and Nurse Practitioners) who all work together to provide you with the care you need, when you need it.  We recommend signing up for the patient portal called "MyChart".  Sign up information is provided on this After Visit Summary.  MyChart is used to connect with patients for Virtual Visits (Telemedicine).  Patients are able to view lab/test results, encounter notes, upcoming appointments, etc.  Non-urgent messages can be sent to your provider as well.   To learn more about what you can do with MyChart, go to [email or web address].    Your next appointment:   Follow up as needed with  The format for your next appointment:   In Person  Provider:   Norman Herrlich, MD    Other Instructions Track your steps with the goal being greater than 2500 steps per day.  Optimal is 8000 - 8500 steps  Important Information About Sugar          [employer]  Tips to measure your blood pressure correctly  To determine whether you have  hypertension, a medical professional will take a blood pressure reading. How you prepare for the test, the position of your arm, and other factors can change a blood pressure reading by 10% or more. That could be enough to hide high blood pressure, start you on a drug you don't really need, or lead your doctor to incorrectly adjust your medications. National and international guidelines offer specific instructions for measuring blood pressure. If a doctor, nurse, or medical assistant isn't doing it right, don't hesitate to ask him or her to get with the guidelines. Here's what you can do to ensure a correct reading:  Don't drink a caffeinated beverage or smoke during the 30 minutes before the test.  Sit quietly for five minutes before the test begins.  During the measurement, sit in a chair with your feet on the floor and your arm supported so your elbow is at about heart level.  The inflatable part of the cuff should completely cover at least 80% of your upper arm, and the cuff should be placed on bare skin, not over a shirt.  Don't talk during the measurement.  Have your blood pressure measured twice, with a brief break in between. If the readings are different by 5 points or more, have it done a third time. There are times to break these rules. If you sometimes feel lightheaded when getting out of bed in the morning or when you stand after sitting, you should have your blood pressure checked while seated and  then while standing to see if it falls from one position to the next. Because blood pressure varies throughout the day, your doctor will rarely diagnose hypertension on the basis of a single reading. Instead, he or she will want to confirm the measurements on at least two occasions, usually within a few weeks of one another. The exception to this rule is if you have a blood pressure reading of 180/110 mm Hg or higher. A result this high usually calls for prompt treatment. It's also a good idea to  have your blood pressure measured in both arms at least once, since the reading in one arm (usually the right) may be higher than that in the left. A 2014 study in The American Journal of Medicine of nearly 3,400 people found average arm- to-arm differences in systolic blood pressure of about 5 points. The higher number should be used to make treatment decisions. In 2017, new guidelines from the American Heart Association, the [employer] of Cardiology, and nine other health organizations lowered the diagnosis of high blood pressure to 130/80 mm Hg or higher for all adults. The guidelines also redefined the various blood pressure categories to now include normal, elevated, Stage 1 hypertension, Stage 2 hypertension, and hypertensive crisis (see "Blood pressure categories"). Blood pressure categories  Blood pressure category SYSTOLIC (upper number)  DIASTOLIC (lower number)  Normal Less than 120 mm Hg and Less than 80 mm Hg  Elevated 120-129 mm Hg and Less than 80 mm Hg  High blood pressure: Stage 1 hypertension 130-139 mm Hg or 80-89 mm Hg  High blood pressure: Stage 2 hypertension 140 mm Hg or higher or 90 mm Hg or higher  Hypertensive crisis (consult your doctor immediately) Higher than 180 mm Hg and/or Higher than 120 mm Hg  Source: American Heart Association and American Stroke Association. For more on getting your blood pressure under control, buy Controlling Your Blood Pressure, a Special Health Report from [facility].

## 2022-10-14 ENCOUNTER — Ambulatory Visit (INDEPENDENT_AMBULATORY_CARE_PROVIDER_SITE_OTHER): Payer: Medicaid Other | Admitting: Physician Assistant

## 2022-10-14 ENCOUNTER — Encounter: Payer: Self-pay | Admitting: Physician Assistant

## 2022-10-14 VITALS — BP 110/82 | HR 80 | Temp 97.1°F | Ht 66.0 in | Wt 270.0 lb

## 2022-10-14 DIAGNOSIS — F419 Anxiety disorder, unspecified: Secondary | ICD-10-CM

## 2022-10-14 DIAGNOSIS — K219 Gastro-esophageal reflux disease without esophagitis: Secondary | ICD-10-CM

## 2022-10-14 MED ORDER — PANTOPRAZOLE SODIUM 40 MG PO TBEC: 40.0000 mg | DELAYED_RELEASE_TABLET | Freq: Every day | ORAL | 3 refills | Status: AC

## 2022-10-14 MED ORDER — BUSPIRONE HCL 5 MG PO TABS: 5.0000 mg | ORAL_TABLET | Freq: Two times a day (BID) | ORAL | 1 refills | Status: DC

## 2022-10-14 NOTE — Progress Notes (Signed)
Subjective:  Patient ID: Angela Martin, female    DOB: 23-Sep-1981  Age: 41 y.o. MRN: [id number]  Chief Complaint  Patient presents with   Gastroesophageal Reflux   Anxiety    HPI  Pt in today with complaints of reflux - she has tried several otc medications and omeprazole without relief.  States when she lays down she has more symptoms of burning up into throat and has burning in mid stomach  Pt with history of anxiety - she has been on medication in the past but several years ago - says she worries all the time, stomach gets upset and she gets irritable Current Outpatient Medications on File Prior to Visit  Medication Sig Dispense Refill   aspirin EC 81 MG tablet Take 81 mg by mouth once. Swallow whole.     ciprofloxacin (CIPRO) 250 MG tablet Take 250 mg by mouth 2 (two) times daily. X 5 DAYS     Garlic 500 MG CAPS Take 500 mg by mouth daily.     No current facility-administered medications on file prior to visit.   Past Medical History:  Diagnosis Date   Acute bursitis of left shoulder 04/28/2021   Benign hypertension 08/06/2022   Bilateral carpal tunnel syndrome 04/28/2021   Cephalalgia 07/16/2020   Cervicalgia 10/09/2020   Chronic back pain 10/08/2020   Depression, major, single episode, severe (HCC) 08/20/2020   Dysuria 10/09/2020   Fibromyalgia 04/28/2021   Hypertension during pregnancy    Migraines    Panic disorder 04/28/2021   Precordial pain 08/06/2022   Scoliosis    Syncope 08/06/2022   Past Surgical History:  Procedure Laterality Date   CESAREAN SECTION     CHOLECYSTECTOMY     plantar wart removal  Right    foot   TONSILLECTOMY      Family History  Problem Relation Age of Onset   Heart murmur Mother    Heart disease Mother    Cancer Father    GER disease Father    Colitis Father    Ovarian cancer Sister        at age 11   Other Sister        disc issues   Congestive Heart Failure Maternal Grandmother    Emphysema Paternal Grandmother    Social  History   Socioeconomic History   Marital status: Married    Spouse name: Not on file   Number of children: Not on file   Years of education: Not on file   Highest education level: Not on file  Occupational History   Not on file  Tobacco Use   Smoking status: Never   Smokeless tobacco: Never  Substance and Sexual Activity   Alcohol use: Never   Drug use: Yes    Types: Marijuana   Sexual activity: Yes  Other Topics Concern   Not on file  Social History Narrative   Not on file   Social Determinants of Health   Financial Resource Strain: Not on file  Food Insecurity: Not on file  Transportation Needs: Not on file  Physical Activity: Not on file  Stress: Not on file  Social Connections: Not on file    Review of Systems CONSTITUTIONAL: Negative for chills, fatigue, fever,  CARDIOVASCULAR: Negative for chest pain, dizziness, palpitations and pedal edema.  RESPIRATORY: Negative for recent cough and dyspnea.  GASTROINTESTINAL: see HPI MSK: Negative for arthralgias and myalgias.  INTEGUMENTARY: Negative for rash.  NEUROLOGICAL: Negative for dizziness and headaches.  PSYCHIATRIC: see HPI  Objective:  PHYSICAL EXAM:   VS: BP 110/82 (BP Location: Left Arm, Patient Position: Sitting, Cuff Size: Large)   Pulse 80   Temp (!) 97.1 F (36.2 C) (Temporal)   Ht 5\' 6"  (1.676 m)   Wt 270 lb (122.5 kg)   SpO2 99%   BMI 43.58 kg/m   GEN: Well nourished, well developed, in no acute distress  Cardiac: RRR; no murmurs, rubs, Respiratory:  normal respiratory rate and pattern with no distress - normal breath sounds with no rales, rhonchi, wheezes or rubs GI: normal bowel sounds, no masses or tenderness Skin: warm and dry, no rash  Psych: euthymic mood, appropriate affect and demeanor     10/14/2022   11:05 AM 09/02/2022   11:11 AM 07/10/2021   11:02 AM 05/28/2021   11:07 AM 04/28/2021   11:30 AM  Depression screen PHQ 2/9  Decreased Interest 0 0 0 3 3  Down, Depressed,  Hopeless 0 0 0 3 3  PHQ - 2 Score 0 0 0 6 6  Altered sleeping 2 3  3 3   Tired, decreased energy 0 3  3 3   Change in appetite 0 0  3 3  Feeling bad or failure about yourself  0 0  3 3  Trouble concentrating  0  0 2  Moving slowly or fidgety/restless 0 0  3 3  Suicidal thoughts 0 0  0 0  PHQ-9 Score 2 6  21 23   Difficult doing work/chores Not difficult at all Somewhat difficult  Very difficult Extremely dIfficult    Lab Results  Component Value Date   WBC 12.2 (H) 08/08/2022   HGB 13.9 08/09/2022   HCT 41.0 08/09/2022   PLT 418 (H) 08/08/2022   GLUCOSE 99 08/13/2022   ALT 18 08/13/2022   AST 18 08/13/2022   NA 138 08/13/2022   K 4.3 08/13/2022   CL 102 08/13/2022   CREATININE 0.71 08/13/2022   BUN 8 08/13/2022   CO2 20 08/13/2022   TSH 1.164 08/09/2022      Assessment & Plan:   Problem List Items Addressed This Visit   None Visit Diagnoses     Gastroesophageal reflux disease without esophagitis    -  Primary   Relevant Medications   pantoprazole (PROTONIX) 40 MG tablet   Anxiety       Relevant Medications   busPIRone (BUSPAR) 5 MG tablet     .  Meds ordered this encounter  Medications   busPIRone (BUSPAR) 5 MG tablet    Sig: Take 1 tablet (5 mg total) by mouth 2 (two) times daily.    Dispense:  60 tablet    Refill:  1    Order Specific Question:   Supervising Provider    Answer:   10/13/2022 10/13/2022   pantoprazole (PROTONIX) 40 MG tablet    Sig: Take 1 tablet (40 mg total) by mouth daily.    Dispense:  30 tablet    Refill:  3    Order Specific Question:   Supervising Provider    Answer:   10/13/2022 10/09/2022    No orders of the defined types were placed in this encounter.    Follow-up: Return in about 4 weeks (around 11/11/2022) for follow up - can be 20 min appt.  An After Visit Summary was printed and given to the patient.  [[id number]] [facility] [phone]

## 2022-11-13 ENCOUNTER — Encounter: Payer: Self-pay | Admitting: Physician Assistant

## 2022-11-13 ENCOUNTER — Ambulatory Visit (INDEPENDENT_AMBULATORY_CARE_PROVIDER_SITE_OTHER): Payer: Medicaid Other | Admitting: Physician Assistant

## 2022-11-13 VITALS — BP 118/68 | HR 85 | Temp 97.5°F | Ht 66.0 in | Wt 275.0 lb

## 2022-11-13 DIAGNOSIS — F419 Anxiety disorder, unspecified: Secondary | ICD-10-CM | POA: Diagnosis not present

## 2022-11-13 DIAGNOSIS — N644 Mastodynia: Secondary | ICD-10-CM | POA: Insufficient documentation

## 2022-11-13 DIAGNOSIS — K219 Gastro-esophageal reflux disease without esophagitis: Secondary | ICD-10-CM | POA: Diagnosis not present

## 2022-11-13 NOTE — Progress Notes (Signed)
Subjective:  Patient ID: Angela Martin, female    DOB: Apr 05, 1981  Age: 42 y.o. MRN: [id number]  Chief Complaint  Patient presents with   Gastroesophageal Reflux    4 week follow up    HPI  Pt in for follow up of GERD - states overall she is feeling better on protonix and reflux symptoms have resolved  Pt states that buspar is helping her anxiety as well - not having any breakthrough symptoms  Pt states that she has never had a mammogram and has been having bilateral breast pain - started several weeks ago.  She is not a smoker and states she does not consume caffeine Current Outpatient Medications on File Prior to Visit  Medication Sig Dispense Refill   aspirin EC 81 MG tablet Take 81 mg by mouth once. Swallow whole.     busPIRone (BUSPAR) 5 MG tablet Take 1 tablet (5 mg total) by mouth 2 (two) times daily. 60 tablet 1   Garlic 017 MG CAPS Take 500 mg by mouth daily.     pantoprazole (PROTONIX) 40 MG tablet Take 1 tablet (40 mg total) by mouth daily. 30 tablet 3   No current facility-administered medications on file prior to visit.   Past Medical History:  Diagnosis Date   Acute bursitis of left shoulder 04/28/2021   Benign hypertension 08/06/2022   Bilateral carpal tunnel syndrome 04/28/2021   Cephalalgia 07/16/2020   Cervicalgia 10/09/2020   Chronic back pain 10/08/2020   Depression, major, single episode, severe ([place]) 08/20/2020   Dysuria 10/09/2020   Fibromyalgia 04/28/2021   Hypertension during pregnancy    Migraines    Panic disorder 04/28/2021   Precordial pain 08/06/2022   Scoliosis    Syncope 08/06/2022   Past Surgical History:  Procedure Laterality Date   CESAREAN SECTION     CHOLECYSTECTOMY     plantar wart removal  Right    foot   TONSILLECTOMY      Family History  Problem Relation Age of Onset   Heart murmur Mother    Heart disease Mother    Cancer Father    GER disease Father    Colitis Father    Ovarian cancer Sister        at age 41   Other Sister         disc issues   Congestive Heart Failure Maternal Grandmother    Emphysema Paternal Grandmother    Social History   Socioeconomic History   Marital status: Married    Spouse name: Not on file   Number of children: Not on file   Years of education: Not on file   Highest education level: Not on file  Occupational History   Not on file  Tobacco Use   Smoking status: Never   Smokeless tobacco: Never  Substance and Sexual Activity   Alcohol use: Never   Drug use: Yes    Types: Marijuana   Sexual activity: Yes  Other Topics Concern   Not on file  Social History Narrative   Not on file   Social Determinants of Health   Financial Resource Strain: Not on file  Food Insecurity: Not on file  Transportation Needs: Not on file  Physical Activity: Not on file  Stress: Not on file  Social Connections: Not on file    Review of Systems CONSTITUTIONAL: Negative for chills, fatigue, fever,  CARDIOVASCULAR: Negative for chest pain, dizziness, palpitations and pedal edema.  RESPIRATORY: Negative for recent cough and dyspnea.  GASTROINTESTINAL: Negative for abdominal pain, acid reflux symptoms, constipation, diarrhea, nausea and vomiting.  Breast - see HPI MSK: Negative for arthralgias and myalgias.  INTEGUMENTARY: Negative for rash.     Objective:  PHYSICAL EXAM:   VS: BP 118/68 (BP Location: Left Arm, Patient Position: Sitting)   Pulse 85   Temp (!) 97.5 F (36.4 C) (Temporal)   Ht 5\' 6"  (1.676 m)   Wt 275 lb (124.7 kg)   SpO2 99%   BMI 44.39 kg/m   GEN: Well nourished, well developed, in no acute distress  Cardiac: RRR; no murmurs, rub Respiratory:  normal respiratory rate and pattern with no distress - normal breath sounds with no rales, rhonchi, wheezes or rubs Skin: warm and dry, no rash  Psych: euthymic mood, appropriate affect and demeanor  GAD score - 8  Lab Results  Component Value Date   WBC 12.2 (H) 08/08/2022   HGB 13.9 08/09/2022   HCT 41.0  08/09/2022   PLT 418 (H) 08/08/2022   GLUCOSE 99 08/13/2022   ALT 18 08/13/2022   AST 18 08/13/2022   NA 138 08/13/2022   K 4.3 08/13/2022   CL 102 08/13/2022   CREATININE 0.71 08/13/2022   BUN 8 08/13/2022   CO2 20 08/13/2022   TSH 1.164 08/09/2022      Assessment & Plan:   Problem List Items Addressed This Visit       Digestive   Gastroesophageal reflux disease without esophagitis Continue protonix as directed     Other   Breast pain - Primary   Relevant Orders   MM Digital Diagnostic Bilat   Anxiety Continue buspar as directed  .  No orders of the defined types were placed in this encounter.   Orders Placed This Encounter  Procedures   MM Digital Diagnostic Bilat     Follow-up: Return in about 3 months (around 02/12/2023) for fasting follow up.  An After Visit Summary was printed and given to the patient.  Yetta Flock [facility] [phone]

## 2022-11-18 ENCOUNTER — Telehealth: Payer: Self-pay | Admitting: Physician Assistant

## 2022-11-18 NOTE — Telephone Encounter (Signed)
   Angela Martin has been scheduled for the following appointment:  WHAT: Diagnostic Mammogram WHERE: [facility] DATE: 11/26/22 TIME: 2:30 PM Check-In  Patient has been made aware.

## 2022-11-26 DIAGNOSIS — R928 Other abnormal and inconclusive findings on diagnostic imaging of breast: Secondary | ICD-10-CM | POA: Diagnosis not present

## 2022-11-26 DIAGNOSIS — N644 Mastodynia: Secondary | ICD-10-CM | POA: Diagnosis not present

## 2022-12-02 ENCOUNTER — Other Ambulatory Visit: Payer: Self-pay

## 2022-12-02 DIAGNOSIS — N644 Mastodynia: Secondary | ICD-10-CM

## 2022-12-10 ENCOUNTER — Other Ambulatory Visit: Payer: Self-pay

## 2022-12-10 DIAGNOSIS — F419 Anxiety disorder, unspecified: Secondary | ICD-10-CM

## 2022-12-10 MED ORDER — BUSPIRONE HCL 5 MG PO TABS: 5.0000 mg | ORAL_TABLET | Freq: Two times a day (BID) | ORAL | 1 refills | Status: AC

## 2022-12-21 DIAGNOSIS — M545 Low back pain, unspecified: Secondary | ICD-10-CM | POA: Diagnosis not present

## 2022-12-21 DIAGNOSIS — M542 Cervicalgia: Secondary | ICD-10-CM | POA: Diagnosis not present

## 2022-12-21 DIAGNOSIS — K219 Gastro-esophageal reflux disease without esophagitis: Secondary | ICD-10-CM | POA: Diagnosis not present

## 2022-12-21 DIAGNOSIS — M129 Arthropathy, unspecified: Secondary | ICD-10-CM | POA: Diagnosis not present

## 2022-12-21 DIAGNOSIS — Z6841 Body Mass Index (BMI) 40.0 and over, adult: Secondary | ICD-10-CM | POA: Diagnosis not present

## 2022-12-21 DIAGNOSIS — R5383 Other fatigue: Secondary | ICD-10-CM | POA: Diagnosis not present

## 2022-12-21 DIAGNOSIS — F419 Anxiety disorder, unspecified: Secondary | ICD-10-CM | POA: Diagnosis not present

## 2022-12-21 DIAGNOSIS — Z0000 Encounter for general adult medical examination without abnormal findings: Secondary | ICD-10-CM | POA: Diagnosis not present

## 2022-12-21 DIAGNOSIS — E559 Vitamin D deficiency, unspecified: Secondary | ICD-10-CM | POA: Diagnosis not present

## 2022-12-21 DIAGNOSIS — R1084 Generalized abdominal pain: Secondary | ICD-10-CM | POA: Diagnosis not present

## 2022-12-21 DIAGNOSIS — F411 Generalized anxiety disorder: Secondary | ICD-10-CM | POA: Diagnosis not present

## 2022-12-22 ENCOUNTER — Encounter: Payer: Self-pay | Admitting: Physician Assistant

## 2023-01-08 DIAGNOSIS — H5213 Myopia, bilateral: Secondary | ICD-10-CM | POA: Diagnosis not present

## 2023-01-14 DIAGNOSIS — R1084 Generalized abdominal pain: Secondary | ICD-10-CM | POA: Diagnosis not present

## 2023-01-16 DIAGNOSIS — R399 Unspecified symptoms and signs involving the genitourinary system: Secondary | ICD-10-CM | POA: Diagnosis not present

## 2023-01-16 DIAGNOSIS — N390 Urinary tract infection, site not specified: Secondary | ICD-10-CM | POA: Diagnosis not present

## 2023-01-16 DIAGNOSIS — Z6841 Body Mass Index (BMI) 40.0 and over, adult: Secondary | ICD-10-CM | POA: Diagnosis not present

## 2023-01-19 DIAGNOSIS — M797 Fibromyalgia: Secondary | ICD-10-CM | POA: Diagnosis not present

## 2023-01-19 DIAGNOSIS — M545 Low back pain, unspecified: Secondary | ICD-10-CM | POA: Diagnosis not present

## 2023-01-19 DIAGNOSIS — G8929 Other chronic pain: Secondary | ICD-10-CM | POA: Diagnosis not present

## 2023-01-19 DIAGNOSIS — E559 Vitamin D deficiency, unspecified: Secondary | ICD-10-CM | POA: Diagnosis not present

## 2023-01-19 DIAGNOSIS — Z6841 Body Mass Index (BMI) 40.0 and over, adult: Secondary | ICD-10-CM | POA: Diagnosis not present

## 2023-01-19 DIAGNOSIS — K219 Gastro-esophageal reflux disease without esophagitis: Secondary | ICD-10-CM | POA: Diagnosis not present

## 2023-01-19 DIAGNOSIS — D500 Iron deficiency anemia secondary to blood loss (chronic): Secondary | ICD-10-CM | POA: Diagnosis not present

## 2023-01-19 DIAGNOSIS — M542 Cervicalgia: Secondary | ICD-10-CM | POA: Diagnosis not present

## 2023-01-19 DIAGNOSIS — F411 Generalized anxiety disorder: Secondary | ICD-10-CM | POA: Diagnosis not present

## 2023-01-19 DIAGNOSIS — R1084 Generalized abdominal pain: Secondary | ICD-10-CM | POA: Diagnosis not present

## 2023-02-10 DIAGNOSIS — R399 Unspecified symptoms and signs involving the genitourinary system: Secondary | ICD-10-CM | POA: Diagnosis not present

## 2023-02-10 DIAGNOSIS — Z6841 Body Mass Index (BMI) 40.0 and over, adult: Secondary | ICD-10-CM | POA: Diagnosis not present

## 2023-02-10 DIAGNOSIS — N390 Urinary tract infection, site not specified: Secondary | ICD-10-CM | POA: Diagnosis not present

## 2023-02-16 DIAGNOSIS — M797 Fibromyalgia: Secondary | ICD-10-CM | POA: Diagnosis not present

## 2023-02-16 DIAGNOSIS — D539 Nutritional anemia, unspecified: Secondary | ICD-10-CM | POA: Diagnosis not present

## 2023-02-16 DIAGNOSIS — Z6841 Body Mass Index (BMI) 40.0 and over, adult: Secondary | ICD-10-CM | POA: Diagnosis not present

## 2023-02-16 DIAGNOSIS — M609 Myositis, unspecified: Secondary | ICD-10-CM | POA: Diagnosis not present

## 2023-02-16 DIAGNOSIS — Z882 Allergy status to sulfonamides status: Secondary | ICD-10-CM | POA: Diagnosis not present

## 2023-02-16 DIAGNOSIS — B3731 Acute candidiasis of vulva and vagina: Secondary | ICD-10-CM | POA: Diagnosis not present

## 2023-02-16 DIAGNOSIS — R5383 Other fatigue: Secondary | ICD-10-CM | POA: Diagnosis not present

## 2023-02-19 ENCOUNTER — Ambulatory Visit: Payer: Medicaid Other | Admitting: Physician Assistant

## 2023-02-23 DIAGNOSIS — K219 Gastro-esophageal reflux disease without esophagitis: Secondary | ICD-10-CM | POA: Diagnosis not present

## 2023-02-28 DIAGNOSIS — K200 Eosinophilic esophagitis: Secondary | ICD-10-CM | POA: Diagnosis not present

## 2023-03-03 DIAGNOSIS — R399 Unspecified symptoms and signs involving the genitourinary system: Secondary | ICD-10-CM | POA: Diagnosis not present

## 2023-03-03 DIAGNOSIS — Z6841 Body Mass Index (BMI) 40.0 and over, adult: Secondary | ICD-10-CM | POA: Diagnosis not present

## 2023-03-05 DIAGNOSIS — N390 Urinary tract infection, site not specified: Secondary | ICD-10-CM | POA: Diagnosis not present

## 2023-03-09 DIAGNOSIS — N390 Urinary tract infection, site not specified: Secondary | ICD-10-CM | POA: Diagnosis not present

## 2023-03-18 DIAGNOSIS — N898 Other specified noninflammatory disorders of vagina: Secondary | ICD-10-CM | POA: Diagnosis not present

## 2023-03-18 DIAGNOSIS — N939 Abnormal uterine and vaginal bleeding, unspecified: Secondary | ICD-10-CM | POA: Diagnosis not present

## 2023-03-18 DIAGNOSIS — R102 Pelvic and perineal pain: Secondary | ICD-10-CM | POA: Diagnosis not present

## 2023-04-01 DIAGNOSIS — N390 Urinary tract infection, site not specified: Secondary | ICD-10-CM | POA: Diagnosis not present

## 2023-04-02 DIAGNOSIS — K219 Gastro-esophageal reflux disease without esophagitis: Secondary | ICD-10-CM | POA: Diagnosis not present

## 2023-04-02 DIAGNOSIS — G8929 Other chronic pain: Secondary | ICD-10-CM | POA: Diagnosis not present

## 2023-04-02 DIAGNOSIS — Z6841 Body Mass Index (BMI) 40.0 and over, adult: Secondary | ICD-10-CM | POA: Diagnosis not present

## 2023-04-02 DIAGNOSIS — M549 Dorsalgia, unspecified: Secondary | ICD-10-CM | POA: Diagnosis not present

## 2023-04-20 DIAGNOSIS — N939 Abnormal uterine and vaginal bleeding, unspecified: Secondary | ICD-10-CM | POA: Diagnosis not present

## 2023-04-27 DIAGNOSIS — Z6841 Body Mass Index (BMI) 40.0 and over, adult: Secondary | ICD-10-CM | POA: Diagnosis not present

## 2023-04-27 DIAGNOSIS — G8929 Other chronic pain: Secondary | ICD-10-CM | POA: Diagnosis not present

## 2023-04-27 DIAGNOSIS — M549 Dorsalgia, unspecified: Secondary | ICD-10-CM | POA: Diagnosis not present

## 2023-05-05 DIAGNOSIS — R102 Pelvic and perineal pain: Secondary | ICD-10-CM | POA: Diagnosis not present

## 2023-05-07 DIAGNOSIS — M791 Myalgia, unspecified site: Secondary | ICD-10-CM | POA: Diagnosis not present

## 2023-05-07 DIAGNOSIS — Z79899 Other long term (current) drug therapy: Secondary | ICD-10-CM | POA: Diagnosis not present

## 2023-07-06 DIAGNOSIS — M549 Dorsalgia, unspecified: Secondary | ICD-10-CM | POA: Diagnosis not present

## 2023-07-06 DIAGNOSIS — M25561 Pain in right knee: Secondary | ICD-10-CM | POA: Diagnosis not present

## 2023-07-06 DIAGNOSIS — Z6841 Body Mass Index (BMI) 40.0 and over, adult: Secondary | ICD-10-CM | POA: Diagnosis not present

## 2023-07-06 DIAGNOSIS — G8929 Other chronic pain: Secondary | ICD-10-CM | POA: Diagnosis not present

## 2023-07-06 DIAGNOSIS — E7800 Pure hypercholesterolemia, unspecified: Secondary | ICD-10-CM | POA: Diagnosis not present

## 2023-07-06 DIAGNOSIS — M129 Arthropathy, unspecified: Secondary | ICD-10-CM | POA: Diagnosis not present

## 2023-07-06 DIAGNOSIS — M25562 Pain in left knee: Secondary | ICD-10-CM | POA: Diagnosis not present

## 2023-07-06 DIAGNOSIS — E559 Vitamin D deficiency, unspecified: Secondary | ICD-10-CM | POA: Diagnosis not present

## 2023-07-06 DIAGNOSIS — Z79899 Other long term (current) drug therapy: Secondary | ICD-10-CM | POA: Diagnosis not present

## 2023-07-06 DIAGNOSIS — M67432 Ganglion, left wrist: Secondary | ICD-10-CM | POA: Diagnosis not present

## 2023-08-06 DIAGNOSIS — G8929 Other chronic pain: Secondary | ICD-10-CM | POA: Diagnosis not present

## 2023-08-06 DIAGNOSIS — M549 Dorsalgia, unspecified: Secondary | ICD-10-CM | POA: Diagnosis not present

## 2023-08-06 DIAGNOSIS — M25562 Pain in left knee: Secondary | ICD-10-CM | POA: Diagnosis not present

## 2023-08-06 DIAGNOSIS — M25561 Pain in right knee: Secondary | ICD-10-CM | POA: Diagnosis not present

## 2023-08-06 DIAGNOSIS — Z6841 Body Mass Index (BMI) 40.0 and over, adult: Secondary | ICD-10-CM | POA: Diagnosis not present

## 2023-08-06 DIAGNOSIS — J301 Allergic rhinitis due to pollen: Secondary | ICD-10-CM | POA: Diagnosis not present

## 2023-08-06 DIAGNOSIS — M67432 Ganglion, left wrist: Secondary | ICD-10-CM | POA: Diagnosis not present
